# Patient Record
Sex: Female | Born: 1940 | Race: White | Hispanic: No | Marital: Married | Smoking: Never smoker
Health system: Southern US, Community
[De-identification: ages and names within clinical notes are randomized; demographics above are authoritative.]

## PROBLEM LIST (undated history)

## (undated) DIAGNOSIS — S060XAA Concussion with loss of consciousness status unknown, initial encounter: Secondary | ICD-10-CM

## (undated) DIAGNOSIS — I10 Essential (primary) hypertension: Secondary | ICD-10-CM

## (undated) DIAGNOSIS — S060X9A Concussion with loss of consciousness of unspecified duration, initial encounter: Secondary | ICD-10-CM

## (undated) DIAGNOSIS — M542 Cervicalgia: Secondary | ICD-10-CM

## (undated) DIAGNOSIS — M549 Dorsalgia, unspecified: Secondary | ICD-10-CM

## (undated) HISTORY — PX: TONSILLECTOMY: SUR1361

## (undated) HISTORY — PX: UMBILICAL HERNIA REPAIR: SHX196

## (undated) HISTORY — PX: NEPHRECTOMY: SHX65

## (undated) HISTORY — PX: BACK SURGERY: SHX140

---

## 2019-01-22 ENCOUNTER — Other Ambulatory Visit: Payer: Self-pay

## 2019-01-22 ENCOUNTER — Encounter (HOSPITAL_COMMUNITY): Payer: Self-pay

## 2019-01-22 ENCOUNTER — Inpatient Hospital Stay (HOSPITAL_COMMUNITY)
Admission: EM | Admit: 2019-01-22 | Discharge: 2019-01-26 | DRG: 522 | Disposition: A | Discharge status: Skilled Nursing Facility | Payer: Medicare Other | Attending: Internal Medicine | Admitting: Internal Medicine

## 2019-01-22 ENCOUNTER — Emergency Department (HOSPITAL_COMMUNITY): Payer: Medicare Other

## 2019-01-22 DIAGNOSIS — X58XXXD Exposure to other specified factors, subsequent encounter: Secondary | ICD-10-CM | POA: Diagnosis present

## 2019-01-22 DIAGNOSIS — S72041A Displaced fracture of base of neck of right femur, initial encounter for closed fracture: Secondary | ICD-10-CM

## 2019-01-22 DIAGNOSIS — M84375D Stress fracture, left foot, subsequent encounter for fracture with routine healing: Secondary | ICD-10-CM | POA: Diagnosis present

## 2019-01-22 DIAGNOSIS — Z905 Acquired absence of kidney: Secondary | ICD-10-CM

## 2019-01-22 DIAGNOSIS — I10 Essential (primary) hypertension: Secondary | ICD-10-CM | POA: Diagnosis present

## 2019-01-22 DIAGNOSIS — Z79899 Other long term (current) drug therapy: Secondary | ICD-10-CM

## 2019-01-22 DIAGNOSIS — W010XXA Fall on same level from slipping, tripping and stumbling without subsequent striking against object, initial encounter: Secondary | ICD-10-CM | POA: Diagnosis present

## 2019-01-22 DIAGNOSIS — Z419 Encounter for procedure for purposes other than remedying health state, unspecified: Secondary | ICD-10-CM | POA: Diagnosis not present

## 2019-01-22 DIAGNOSIS — S72001A Fracture of unspecified part of neck of right femur, initial encounter for closed fracture: Principal | ICD-10-CM | POA: Diagnosis present

## 2019-01-22 DIAGNOSIS — M542 Cervicalgia: Secondary | ICD-10-CM

## 2019-01-22 DIAGNOSIS — Y92512 Supermarket, store or market as the place of occurrence of the external cause: Secondary | ICD-10-CM

## 2019-01-22 DIAGNOSIS — W19XXXA Unspecified fall, initial encounter: Secondary | ICD-10-CM

## 2019-01-22 DIAGNOSIS — D62 Acute posthemorrhagic anemia: Secondary | ICD-10-CM | POA: Diagnosis not present

## 2019-01-22 DIAGNOSIS — Z20828 Contact with and (suspected) exposure to other viral communicable diseases: Secondary | ICD-10-CM | POA: Diagnosis present

## 2019-01-22 DIAGNOSIS — G8929 Other chronic pain: Secondary | ICD-10-CM | POA: Diagnosis present

## 2019-01-22 DIAGNOSIS — Z888 Allergy status to other drugs, medicaments and biological substances status: Secondary | ICD-10-CM

## 2019-01-22 DIAGNOSIS — N179 Acute kidney failure, unspecified: Secondary | ICD-10-CM | POA: Diagnosis present

## 2019-01-22 DIAGNOSIS — Z96649 Presence of unspecified artificial hip joint: Secondary | ICD-10-CM

## 2019-01-22 DIAGNOSIS — D649 Anemia, unspecified: Secondary | ICD-10-CM | POA: Diagnosis present

## 2019-01-22 HISTORY — DX: Dorsalgia, unspecified: M54.9

## 2019-01-22 HISTORY — DX: Concussion with loss of consciousness of unspecified duration, initial encounter: S06.0X9A

## 2019-01-22 HISTORY — DX: Concussion with loss of consciousness status unknown, initial encounter: S06.0XAA

## 2019-01-22 HISTORY — DX: Cervicalgia: M54.2

## 2019-01-22 HISTORY — DX: Essential (primary) hypertension: I10

## 2019-01-22 LAB — BASIC METABOLIC PANEL
Anion gap: 13 (ref 5–15)
BUN: 28 mg/dL — ABNORMAL HIGH (ref 8–23)
CO2: 22 mmol/L (ref 22–32)
Calcium: 9.3 mg/dL (ref 8.9–10.3)
Chloride: 108 mmol/L (ref 98–111)
Creatinine, Ser: 1.35 mg/dL — ABNORMAL HIGH (ref 0.44–1.00)
GFR calc Af Amer: 44 mL/min — ABNORMAL LOW (ref >60)
GFR calc non Af Amer: 38 mL/min — ABNORMAL LOW (ref >60)
Glucose, Bld: 125 mg/dL — ABNORMAL HIGH (ref 70–99)
Potassium: 4 mmol/L (ref 3.5–5.1)
Sodium: 143 mmol/L (ref 135–145)

## 2019-01-22 LAB — TYPE AND SCREEN
ABO/RH(D): A POS
Antibody Screen: NEGATIVE

## 2019-01-22 LAB — CBC WITH DIFFERENTIAL/PLATELET
Abs Immature Granulocytes: 0.1 10*3/uL — ABNORMAL HIGH (ref 0.00–0.07)
Basophils Absolute: 0.1 10*3/uL (ref 0.0–0.1)
Basophils Relative: 1 %
Eosinophils Absolute: 0.1 10*3/uL (ref 0.0–0.5)
Eosinophils Relative: 1 %
HCT: 36.4 % (ref 36.0–46.0)
Hemoglobin: 11.4 g/dL — ABNORMAL LOW (ref 12.0–15.0)
Immature Granulocytes: 1 %
Lymphocytes Relative: 28 %
Lymphs Abs: 3.1 10*3/uL (ref 0.7–4.0)
MCH: 30.1 pg (ref 26.0–34.0)
MCHC: 31.3 g/dL (ref 30.0–36.0)
MCV: 96 fL (ref 80.0–100.0)
Monocytes Absolute: 0.6 10*3/uL (ref 0.1–1.0)
Monocytes Relative: 5 %
Neutro Abs: 6.9 10*3/uL (ref 1.7–7.7)
Neutrophils Relative %: 64 %
Platelets: 217 10*3/uL (ref 150–400)
RBC: 3.79 MIL/uL — ABNORMAL LOW (ref 3.87–5.11)
RDW: 14.1 % (ref 11.5–15.5)
WBC: 10.8 10*3/uL — ABNORMAL HIGH (ref 4.0–10.5)
nRBC: 0 % (ref 0.0–0.2)

## 2019-01-22 LAB — ABO/RH: ABO/RH(D): A POS

## 2019-01-22 LAB — SARS CORONAVIRUS 2 BY RT PCR (HOSPITAL ORDER, PERFORMED IN ~~LOC~~ HOSPITAL LAB): SARS Coronavirus 2: NEGATIVE

## 2019-01-22 MED ORDER — CEFAZOLIN SODIUM-DEXTROSE 2-4 GM/100ML-% IV SOLN
2.0000 g | INTRAVENOUS | Status: AC
  Administered 2019-01-23: 09:00:00 2 g via INTRAVENOUS
  Filled 2019-01-22: qty 100

## 2019-01-22 MED ORDER — FENTANYL CITRATE (PF) 100 MCG/2ML IJ SOLN
75.0000 ug | Freq: Once | INTRAMUSCULAR | Status: AC
  Administered 2019-01-22: 75 ug via INTRAVENOUS
  Filled 2019-01-22: qty 2

## 2019-01-22 MED ORDER — POVIDONE-IODINE 10 % EX SWAB: 2.0000 "application " | Freq: Once | CUTANEOUS | Status: DC

## 2019-01-22 MED ORDER — HYDRALAZINE HCL 20 MG/ML IJ SOLN: 10.0000 mg | INTRAMUSCULAR | Status: DC | PRN

## 2019-01-22 MED ORDER — TRANEXAMIC ACID 1000 MG/10ML IV SOLN: 2000.0000 mg | INTRAVENOUS | Status: DC

## 2019-01-22 MED ORDER — TRANEXAMIC ACID 1000 MG/10ML IV SOLN
2000.0000 mg | INTRAVENOUS | Status: AC
  Filled 2019-01-22: qty 20

## 2019-01-22 MED ORDER — ENSURE PRE-SURGERY PO LIQD
296.0000 mL | Freq: Once | ORAL | Status: AC
  Administered 2019-01-22: 296 mL via ORAL
  Filled 2019-01-22: qty 296

## 2019-01-22 MED ORDER — TRANEXAMIC ACID-NACL 1000-0.7 MG/100ML-% IV SOLN
1000.0000 mg | INTRAVENOUS | Status: AC
  Administered 2019-01-23: 10:00:00 1000 mg via INTRAVENOUS

## 2019-01-22 MED ORDER — TRANEXAMIC ACID-NACL 1000-0.7 MG/100ML-% IV SOLN: 1000.0000 mg | INTRAVENOUS | Status: DC

## 2019-01-22 MED ORDER — ENSURE PRE-SURGERY PO LIQD
296.0000 mL | Freq: Once | ORAL | Status: DC
  Filled 2019-01-22: qty 296

## 2019-01-22 MED ORDER — MORPHINE SULFATE (PF) 2 MG/ML IV SOLN
0.5000 mg | INTRAVENOUS | Status: DC | PRN
  Administered 2019-01-22 – 2019-01-23 (×3): 0.5 mg via INTRAVENOUS
  Filled 2019-01-22 (×4): qty 1

## 2019-01-22 MED ORDER — CEFAZOLIN SODIUM-DEXTROSE 2-4 GM/100ML-% IV SOLN: 2.0000 g | INTRAVENOUS | Status: DC

## 2019-01-22 NOTE — ED Triage Notes (Signed)
Patient arrives via EMS due to falling in [place]. Per ems, patient was walking with her walker when she lost her footing and fell on her right side. No LOC and patient did not hit her head.   Per patient, she recently started taking lyrica and it has made her unsteady.    Patient arrives with a 20g  L. AC  EMS medicated PTA with 124mcg of Fentanyl and 4mg  of Zofran.

## 2019-01-22 NOTE — H&P (Signed)
History and Physical    Danielle FreshwaterVera Ayala YNW:[id number] DOB: 1940-11-03 DOA: 01/22/2019  PCP: Jolene ProvostHaimes, David M, MD  Patient coming from: Home.  Chief Complaint: Fall.  HPI: Danielle FreshwaterVera Ayala is a 78 y.o. female with history of hypertension with history of left-sided nephrectomy for tumor, neck pain on Lyrica was brought to the ER [facility]  Patient states she thinks she may have tripped on something and fell.  Denies hitting head or losing consciousness.  Denies any chest pain or shortness of breath.  Since patient is having pain in the right hip area was brought to the ER.  Patient has had a recent left foot stress fracture for which patient is wearing a brace.  ED Course: X-ray showed right hip fracture.  On-call orthopedic surgeon Dr. Roda ShuttersXu has been consulted and plans to do surgery in the morning.  Point-of-care COVID-19 was negative.  Chest x-ray unremarkable.  Labs show mild elevated creatinine from baseline when compared to May 2020 presently it is around 1.3.  Hemoglobin is around baseline with chronic anemia.  Review of Systems: As per HPI, rest all negative.   Past Medical History:  Diagnosis Date  . Back pain   . Concussion 1967 & 1985  . Hypertension   . Neck pain     Past Surgical History:  Procedure Laterality Date  . BACK SURGERY    . NEPHRECTOMY Left   . TONSILLECTOMY    . UMBILICAL HERNIA REPAIR       reports that she has never smoked. She has never used smokeless tobacco. She reports that she does not drink alcohol or use drugs.  Allergies  Allergen Reactions  . Cleocin [Clindamycin Hcl] Rash  . Cholesterol     Cholesterol medications make her sick    Family History  Problem Relation Age of Onset  . Cancer Mother     Prior to Admission medications   Medication Sig Start Date End Date Taking? Authorizing Provider  docusate sodium (COLACE) 100 MG capsule Take 100 mg by mouth daily.   Yes [provider]  losartan (COZAAR) 100 MG  tablet Take 100 mg by mouth daily.   Yes [provider]  pregabalin (LYRICA) 150 MG capsule Take 150 mg by mouth 2 (two) times daily.   Yes [provider]    Physical Exam: Constitutional: Moderately built and nourished. Vitals:   01/22/19 1946 01/22/19 2015 01/22/19 2100 01/22/19 2137  BP: (!) 173/65 (!) 154/60 (!) 155/73 (!) 164/73  Pulse: 76 72 67 74  Resp: 13 (!) 23 17   Temp:    98.3 F (36.8 C)  TempSrc:    Oral  SpO2: 95% 97% 99% 96%  Weight:      Height:       Eyes: Anicteric no pallor. ENMT: No discharge from the ears eyes nose or mouth. Neck: No mass or.  No neck rigidity. Respiratory: No rhonchi or crepitations. Cardiovascular: S1-S2 heard. Abdomen: Soft nontender bowel sounds present. Musculoskeletal: Left foot has a brace.  Right foot lower extremity has pain on movement. Skin: No rash. Neurologic: Alert awake oriented to time place and person.  Moves all extremities. Psychiatric: Appears normal.   Labs on Admission: I have personally reviewed following labs and imaging studies  CBC: Recent Labs  Lab 01/22/19 1745  WBC 10.8*  NEUTROABS 6.9  HGB 11.4*  HCT 36.4  MCV 96.0  PLT 217   Basic Metabolic Panel: Recent Labs  Lab 01/22/19  1745  NA 143  K 4.0  CL 108  CO2 22  GLUCOSE 125*  BUN 28*  CREATININE 1.35*  CALCIUM 9.3   GFR: Estimated Creatinine Clearance: 36.7 mL/min (A) (by C-G formula based on SCr of 1.35 mg/dL (H)). Liver Function Tests: No results for input(s): AST, ALT, ALKPHOS, BILITOT, PROT, ALBUMIN in the last 168 hours. No results for input(s): LIPASE, AMYLASE in the last 168 hours. No results for input(s): AMMONIA in the last 168 hours. Coagulation Profile: No results for input(s): INR, PROTIME in the last 168 hours. Cardiac Enzymes: No results for input(s): CKTOTAL, CKMB, CKMBINDEX, TROPONINI in the last 168 hours. BNP (last 3 results) No results for input(s): PROBNP in the last 8760 hours. HbA1C: No  results for input(s): HGBA1C in the last 72 hours. CBG: No results for input(s): GLUCAP in the last 168 hours. Lipid Profile: No results for input(s): CHOL, HDL, LDLCALC, TRIG, CHOLHDL, LDLDIRECT in the last 72 hours. Thyroid Function Tests: No results for input(s): TSH, T4TOTAL, FREET4, T3FREE, THYROIDAB in the last 72 hours. Anemia Panel: No results for input(s): VITAMINB12, FOLATE, FERRITIN, TIBC, IRON, RETICCTPCT in the last 72 hours. Urine analysis: No results found for: COLORURINE, APPEARANCEUR, LABSPEC, PHURINE, GLUCOSEU, HGBUR, BILIRUBINUR, KETONESUR, PROTEINUR, UROBILINOGEN, NITRITE, LEUKOCYTESUR Sepsis Labs: @LABRCNTIP (procalcitonin:4,lacticidven:4) ) Recent Results (from the past 240 hour(s))  SARS Coronavirus 2 by RT PCR (hospital order, performed in [facility] hospital lab) Nasopharyngeal Nasopharyngeal Swab     Status: None   Collection Time: 01/22/19  8:02 PM   Specimen: Nasopharyngeal Swab  Result Value Ref Range Status   SARS Coronavirus 2 NEGATIVE NEGATIVE Final    Comment: (NOTE) SARS-CoV-2 target nucleic acids are NOT DETECTED. The SARS-CoV-2 RNA is generally detectable in upper and lower respiratory specimens during the acute phase of infection. The lowest concentration of SARS-CoV-2 viral copies this assay can detect is 250 copies / mL. A negative result does not preclude SARS-CoV-2 infection and should not be used as the sole basis for treatment or other patient management decisions.  A negative result may occur with improper specimen collection / handling, submission of specimen other than nasopharyngeal swab, presence of viral mutation(s) within the areas targeted by this assay, and inadequate number of viral copies (<250 copies / mL). A negative result must be combined with clinical observations, patient history, and epidemiological information. Fact Sheet for Patients:   14/04/20 Fact Sheet for Healthcare  Providers: BoilerBrush.com.cy This test is not yet approved or cleared  by the https://pope.com/ FDA and has been authorized for detection and/or diagnosis of SARS-CoV-2 by FDA under an Emergency Use Authorization (EUA).  This EUA will remain in effect (meaning this test can be used) for the duration of the COVID-19 declaration under Section 564(b)(1) of the Act, 21 U.S.C. section 360bbb-3(b)(1), unless the authorization is terminated or revoked sooner. Performed at [facility], [address] [place], [place]      Radiological Exams on Admission: Dg Chest 1 View  Result Date: 01/22/2019 CLINICAL DATA:  Preop EXAM: CHEST  1 VIEW COMPARISON:  None. FINDINGS: The heart size and mediastinal contours are within normal limits. Both lungs are clear. The visualized skeletal structures are unremarkable. IMPRESSION: No active disease. Electronically Signed   By: 14/05/2018 M.D.   On: 01/22/2019 19:11   Dg Hip Unilat With Pelvis 2-3 Views Right  Result Date: 01/22/2019 CLINICAL DATA:  Fall from standing EXAM: DG HIP (WITH OR WITHOUT PELVIS) 2-3V RIGHT COMPARISON:  None.  FINDINGS: There is a mildly displaced fracture of the right femoral neck with superior subluxation of the femoral shaft. The femoral head still articulates with the acetabulum. There also appears to be cortical disruption of the inferior left sacrum. Diffuse osteopenia seen. 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 IMPRESSION: Mildly displaced right femoral neck fracture with superior subluxation of the femoral shaft. Question of a nondisplaced fracture of the left sacral ala. Electronically Signed   By: Prudencio Pair M.D.   On: 01/22/2019 19:11     Assessment/Plan Principal Problem:   Closed fracture of neck of right femur (HCC) Active Problems:   Essential hypertension   Normochromic normocytic anemia   Closed right hip fracture, initial encounter ([place])    1. Right hip fracture status  post mechanical fall for which orthopedic surgeon has been consulted.  We will keep patient n.p.o. past midnight.  Pain relief medications.  Further recommendations per orthopedics. 2. Hypertension -since patient is going to be n.p.o. and also has mildly elevated creatinine from baseline for now we will hold off ARB and keep patient on as needed IV hydralazine.  After surgery if creatinine is improving may resume ARB. 3. Acute renal failure with single kidney has had previous history of nephrectomy on the left side for edema.  Cause not clear.  Creatinine is mildly elevated from previous.  Will hold ARB gently hydrate.  Follow metabolic panel. 4. Anemia appears to be chronic follow CBC. 5. History of neck pain on Lyrica which can be restarted after surgery.  Given the hip fracture will need more than 2 midnight stay in inpatient status. Point-of-care Covid was negative.  Repeat Covid for 24 hours PCR is pending.   DVT prophylaxis: SCDs in anticipation of procedure. Code Status: Full code. Family Communication: Discussed with patient. Disposition Plan: Home. Consults called: Orthopedics. Admission status: Inpatient.   Rise Patience MD [facility] Pager [phone].  If 7PM-7AM, please contact night-coverage [email or web address] Password [facility]  01/22/2019, 10:36 PM

## 2019-01-22 NOTE — Consult Note (Signed)
ORTHOPAEDIC CONSULTATION  REQUESTING PHYSICIAN: Eduard Clos, MD  Chief Complaint: Right femoral neck fracture  HPI: Danielle Ayala is a 78 y.o. female who presents with right hip fracture s/p mechanical fall at [employer].  Witnesses state she tripped over a sign.  She's been wearing a postop shoe on her left foot for a stress fracture.  The patient endorses severe pain in the right hip, that does not radiate, grinding in quality, worse with any movement, better with immobilization.  Denies LOC/fever/chills/nausea/vomiting.  Walks without assistive devices (walker, cane, wheelchair).  Lives with husband at home and still drives herself and is independent with ADLs.  Denies LOC, neck pain, abd pain.  Past Medical History:  Diagnosis Date  . Back pain   . Concussion 1967 & 1985  . Neck pain    Past Surgical History:  Procedure Laterality Date  . BACK SURGERY    . NEPHRECTOMY Left   . TONSILLECTOMY    . UMBILICAL HERNIA REPAIR     Social History   Socioeconomic History  . Marital status: Married    Spouse name: Not on file  . Number of children: Not on file  . Years of education: Not on file  . Highest education level: Not on file  Occupational History  . Not on file  Social Needs  . Financial resource strain: Not on file  . Food insecurity    Worry: Not on file    Inability: Not on file  . Transportation needs    Medical: Not on file    Non-medical: Not on file  Tobacco Use  . Smoking status: Never Smoker  . Smokeless tobacco: Never Used  Substance and Sexual Activity  . Alcohol use: Never    Frequency: Never  . Drug use: Never  . Sexual activity: Not on file  Lifestyle  . Physical activity    Days per week: Not on file    Minutes per session: Not on file  . Stress: Not on file  Relationships  . Social Musician on phone: Not on file    Gets together: Not on file    Attends religious service: Not on file    Active member of club or organization:  Not on file    Attends meetings of clubs or organizations: Not on file    Relationship status: Not on file  Other Topics Concern  . Not on file  Social History Narrative  . Not on file   Family History  Problem Relation Age of Onset  . Cancer Mother    Allergies  Allergen Reactions  . Cleocin [Clindamycin Hcl] Rash  . Cholesterol     Cholesterol medications make her sick   Prior to Admission medications   Medication Sig Start Date End Date Taking? Authorizing Provider  docusate sodium (COLACE) 100 MG capsule Take 100 mg by mouth daily.   Yes [provider]  losartan (COZAAR) 100 MG tablet Take 100 mg by mouth daily.   Yes [provider]  pregabalin (LYRICA) 150 MG capsule Take 150 mg by mouth 2 (two) times daily.   Yes [provider]   Dg Chest 1 View  Result Date: 01/22/2019 CLINICAL DATA:  Preop EXAM: CHEST  1 VIEW COMPARISON:  None. FINDINGS: The heart size and mediastinal contours are within normal limits. Both lungs are clear. The visualized skeletal structures are unremarkable. IMPRESSION: No active disease. Electronically Signed   By: Jonna Clark M.D.   On:  01/22/2019 19:11   Dg Hip Unilat With Pelvis 2-3 Views Right  Result Date: 01/22/2019 CLINICAL DATA:  Fall from standing EXAM: DG HIP (WITH OR WITHOUT PELVIS) 2-3V RIGHT COMPARISON:  None. FINDINGS: There is a mildly displaced fracture of the right femoral neck with superior subluxation of the femoral shaft. The femoral head still articulates with the acetabulum. There also appears to be cortical disruption of the inferior left sacrum. Diffuse osteopenia seen. 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 IMPRESSION: Mildly displaced right femoral neck fracture with superior subluxation of the femoral shaft. Question of a nondisplaced fracture of the left sacral ala. Electronically Signed   By: Prudencio Pair M.D.   On: 01/22/2019 19:11    All pertinent xrays, MRI, CT independently reviewed and  interpreted  Positive ROS: All other systems have been reviewed and were otherwise negative with the exception of those mentioned in the HPI and as above.  Physical Exam: General: Alert, no acute distress Cardiovascular: No pedal edema Respiratory: No cyanosis, no use of accessory musculature GI: No organomegaly, abdomen is soft and non-tender Skin: No lesions in the area of chief complaint Neurologic: Sensation intact distally Psychiatric: Patient is competent for consent with normal mood and affect Lymphatic: No axillary or cervical lymphadenopathy  MUSCULOSKELETAL:  - severe pain with movement of the hip and extremity - skin intact - NVI distally - compartments soft  Assessment: Right femoral neck fracture  Plan: - total hip replacement is recommended, patient and family are aware of r/b/a and wish to proceed, informed consent obtained - medical optimization per primary team - surgery is planned for tomorrow morning - NPO after midnight - hold lovenox  Thank you for the consult and the opportunity to see Danielle Ayala. Eduard Roux, MD [facility] 9:53 PM

## 2019-01-22 NOTE — ED Notes (Signed)
ED TO INPATIENT HANDOFF REPORT  ED Nurse Name and Phone #: Rever Pichette [phone]  S Name/Age/Gender Danielle Ayala 78 y.o. female Room/Bed: 040C/040C  Code Status   Code Status: Not on file  Home/SNF/Other Home Patient oriented to: self, place, time and situation Is this baseline? Yes   Triage Complete: Triage complete  Chief Complaint Fall, Hip pain  Triage Note Patient arrives via EMS due to falling in [employer]. Per ems, patient was walking with her walker when she lost her footing and fell on her right side. No LOC and patient did not hit her head.   Per patient, she recently started taking lyrica and it has made her unsteady.    Patient arrives with a 20g  L. AC  EMS medicated PTA with of Fentanyl and 4mg  of Zofran.    Allergies Allergies  Allergen Reactions  . Cleocin [Clindamycin Hcl] Rash  . Cholesterol     Cholesterol medications make her sick    Level of Care/Admitting Diagnosis ED Disposition    ED Disposition Condition Comment   Admit  Hospital Area: [facility] [[id number]]  Level of Care: Telemetry Medical [104]  Covid Evaluation: Asymptomatic Screening Protocol (No Symptoms)  Diagnosis: [facility]  Admitting Physician: ) [[id number]] [phone]  Attending Physician: [[phone]  Estimated length of stay: past midnight tomorrow  Certification:: I certify this patient will need inpatient services for at least 2 midnights  PT Class (Do Not Modify): Inpatient [101]  PT Acc Code (Do Not Modify): Private [1]       B Medical/Surgery History Past Medical History:  Diagnosis Date  . Back pain   . Concussion 1967 & 1985  . Neck pain    Past Surgical History:  Procedure Laterality Date  . BACK SURGERY    . NEPHRECTOMY Left   . TONSILLECTOMY    . UMBILICAL HERNIA REPAIR       A IV Location/Drains/Wounds Patient Lines/Drains/Airways Status   Active Line/Drains/Airways    Name:   Placement date:    Placement time:   Site:   Days:   Peripheral IV 01/22/19 Right Hand   01/22/19    1420    Hand   less than 1          Intake/Output Last 24 hours No intake or output data in the 24 hours ending 01/22/19 2038  Labs/Imaging Results for orders placed or performed during the hospital encounter of 01/22/19 (from the past 48 hour(s))  Basic metabolic panel     Status: Abnormal   Collection Time: 01/22/19  5:45 PM  Result Value Ref Range   Sodium 143 135 - 145 mmol/L   Potassium 4.0 3.5 - 5.1 mmol/L    Comment: SLIGHT HEMOLYSIS   Chloride 108 98 - 111 mmol/L   CO2 22 22 - 32 mmol/L   Glucose, Bld 125 (H) 70 - 99 mg/dL   BUN 28 (H) 8 - 23 mg/dL   Creatinine, Ser 14/04/20 (H) 0.44 - 1.00 mg/dL   Calcium 9.3 8.9 - 6.80 mg/dL   GFR calc non Af Amer 38 (L) >60 mL/min   GFR calc Af Amer 44 (L) >60 mL/min   Anion gap 13 5 - 15    Comment: Performed at [facility], [address] [place], [place]  CBC with Differential     Status: Abnormal   Collection Time: 01/22/19  5:45 PM  Result Value Ref Range   WBC  10.8 (H) 4.0 - 10.5 K/uL   RBC 3.79 (L) 3.87 - 5.11 MIL/uL   Hemoglobin 11.4 (L) 12.0 - 15.0 g/dL   HCT 36.4 36.0 - 46.0 %   MCV 96.0 80.0 - 100.0 fL   MCH 30.1 26.0 - 34.0 pg   MCHC 31.3 30.0 - 36.0 g/dL   RDW 14.1 11.5 - 15.5 %   Platelets 217 150 - 400 K/uL   nRBC 0.0 0.0 - 0.2 %   Neutrophils Relative % 64 %   Neutro Abs 6.9 1.7 - 7.7 K/uL   Lymphocytes Relative 28 %   Lymphs Abs 3.1 0.7 - 4.0 K/uL   Monocytes Relative 5 %   Monocytes Absolute 0.6 0.1 - 1.0 K/uL   Eosinophils Relative 1 %   Eosinophils Absolute 0.1 0.0 - 0.5 K/uL   Basophils Relative 1 %   Basophils Absolute 0.1 0.0 - 0.1 K/uL   Immature Granulocytes 1 %   Abs Immature Granulocytes 0.10 (H) 0.00 - 0.07 K/uL    Comment: Performed at [place] [address], [place], [place] [address]  Type and screen [place]     Status: None   Collection Time: 01/22/19  5:48 PM   Result Value Ref Range   ABO/RH(D) A POS    Antibody Screen NEG    Sample Expiration      01/25/2019,2359 Performed at [facility], [place] [address], [place], [place] [address]   ABO/Rh     Status: None   Collection Time: 01/22/19  5:48 PM  Result Value Ref Range   ABO/RH(D)      A POS Performed at [place] [address] [place], [place] [address]    Dg Chest 1 View  Result Date: 01/22/2019 CLINICAL DATA:  Preop EXAM: CHEST  1 VIEW COMPARISON:  None. FINDINGS: The heart size and mediastinal contours are within normal limits. Both lungs are clear. The visualized skeletal structures are unremarkable. IMPRESSION: No active disease. Electronically Signed   By: Prudencio Pair M.D.   On: 01/22/2019 19:11   Dg Hip Unilat With Pelvis 2-3 Views Right  Result Date: 01/22/2019 CLINICAL DATA:  Fall from standing EXAM: DG HIP (WITH OR WITHOUT PELVIS) 2-3V RIGHT COMPARISON:  None. FINDINGS: There is a mildly displaced fracture of the right femoral neck with superior subluxation of the femoral shaft. The femoral head still articulates with the acetabulum. There also appears to be cortical disruption of the inferior left sacrum. Diffuse osteopenia seen. 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 IMPRESSION: Mildly displaced right femoral neck fracture with superior subluxation of the femoral shaft. Question of a nondisplaced fracture of the left sacral ala. Electronically Signed   By: Prudencio Pair M.D.   On: 01/22/2019 19:11    Pending Labs Unresulted Labs (From admission, onward)    Start     Ordered   01/22/19 1857  SARS Coronavirus 2 by RT PCR (hospital order, performed in [facility] hospital lab) Nasopharyngeal Nasopharyngeal Swab  ONCE - STAT,   STAT    Comments: Pt going to the or tonight   Question Answer Comment  Is this test for diagnosis or screening Screening   Symptomatic for COVID-19 as defined by CDC No   Hospitalized for COVID-19 No   Admitted to ICU for COVID-19 No    Previously tested for COVID-19 No   Resident in a congregate (group) care setting Unknown   Employed in healthcare setting Unknown  Pregnant No      01/22/19 1857          Vitals/Pain Today's Vitals   01/22/19 1855 01/22/19 1946 01/22/19 1956 01/22/19 2015  BP:  (!) 173/65  (!) 154/60  Pulse:  76  72  Resp:  13  (!) 23  Temp:      TempSrc:      SpO2:  95%  97%  Weight:      Height:      PainSc: 9   4      Isolation Precautions No active isolations  Medications Medications  fentaNYL (SUBLIMAZE) injection 75 mcg (75 mcg Intravenous Given 01/22/19 1746)  fentaNYL (SUBLIMAZE) injection 75 mcg (75 mcg Intravenous Given 01/22/19 2000)    Mobility walks with person assist High fall risk   Focused Assessments musc   R Recommendations: See Admitting Provider Note  Report given to:   Additional Notes:

## 2019-01-22 NOTE — ED Provider Notes (Signed)
[facility] EMERGENCY DEPARTMENT Provider Note   CSN: [id number] Arrival date & time: 01/22/19  1656     History   Chief Complaint Chief Complaint  Patient presents with  . Fall    HPI Danielle Ayala is a 78 y.o. female presenting to the ED via EMS with complaint of sudden onset of right hip pain after mechanical fall that occurred prior to arrival.  Patient states she lost her balance which is been typical for her after starting the medication Lyrica for her chronic neck pain.  She states she lost her balance falling onto her right hip with sudden onset of pain.  Pain is worse with any movement or palpation located in her right groin.  She denies numbness or tingling to the right foot.  Denies head trauma or LOC.  No new neck or back pain.  No other injuries reported.  Not on anticoagulation.  She was given 100 mcg of fentanyl in route by EMS with significant improvement in symptoms, however she states pain is somewhat returning and significantly worse with any slight movement.     The history is provided by the patient.    Past Medical History:  Diagnosis Date  . Back pain   . Concussion 1967 & 1985  . Neck pain     Patient Active Problem List   Diagnosis Date Noted  . Closed displaced fracture of right femoral neck (HCC) 01/22/2019  . Hip fracture (HCC) 01/22/2019    Past Surgical History:  Procedure Laterality Date  . BACK SURGERY    . NEPHRECTOMY Left   . TONSILLECTOMY    . UMBILICAL HERNIA REPAIR       OB History   No obstetric history on file.      Home Medications    Prior to Admission medications   Not on File    Family History Family History  Problem Relation Age of Onset  . Cancer Mother     Social History Social History   Tobacco Use  . Smoking status: Never Smoker  . Smokeless tobacco: Never Used  Substance Use Topics  . Alcohol use: Never    Frequency: Never  . Drug use: Never     Allergies   Cleocin [clindamycin  hcl] and Cholesterol   Review of Systems Review of Systems  All other systems reviewed and are negative.    Physical Exam Updated Vital Signs BP (!) 157/71   Pulse 63   Temp 98.1 F (36.7 C) (Oral)   Resp 18   Ht 5\' 4"  (1.626 m)   Wt 81.6 kg   SpO2 100%   BMI 30.90 kg/m   Physical Exam Vitals signs and nursing note reviewed.  Constitutional:      Appearance: She is well-developed.  HENT:     Head: Normocephalic and atraumatic.  Eyes:     Conjunctiva/sclera: Conjunctivae normal.  Neck:     Musculoskeletal: Normal range of motion.  Cardiovascular:     Rate and Rhythm: Normal rate and regular rhythm.  Pulmonary:     Effort: Pulmonary effort is normal. No respiratory distress.     Breath sounds: Normal breath sounds.  Abdominal:     General: Bowel sounds are normal.     Palpations: Abdomen is soft.     Tenderness: There is no abdominal tenderness.  Musculoskeletal:     Comments: Right LE with slight shortening with external rotation. Unable to range hip 2/t to pain. Right knee and ankle are atraumatic  without TTP.  Left foot with post-op shoe in place. BUE are atraumatic with full nl ROM without pain.  Skin:    General: Skin is warm.  Neurological:     Mental Status: She is alert and oriented to person, place, and time.     Comments: Normal sensation to BLE. Strong DP pulses  Psychiatric:        Behavior: Behavior normal.      ED Treatments / Results  Labs (all labs ordered are listed, but only abnormal results are displayed) Labs Reviewed  BASIC METABOLIC PANEL - Abnormal; Notable for the following components:      Result Value   Glucose, Bld 125 (*)    BUN 28 (*)    Creatinine, Ser 1.35 (*)    GFR calc non Af Amer 38 (*)    GFR calc Af Amer 44 (*)    All other components within normal limits  CBC WITH DIFFERENTIAL/PLATELET - Abnormal; Notable for the following components:   WBC 10.8 (*)    RBC 3.79 (*)    Hemoglobin 11.4 (*)    Abs Immature  Granulocytes 0.10 (*)    All other components within normal limits  SARS CORONAVIRUS 2 BY RT PCR (HOSPITAL ORDER, PERFORMED IN [facility])  TYPE AND SCREEN  ABO/RH    EKG None  Radiology Dg Chest 1 View  Result Date: 01/22/2019 CLINICAL DATA:  Preop EXAM: CHEST  1 VIEW COMPARISON:  None. FINDINGS: The heart size and mediastinal contours are within normal limits. Both lungs are clear. The visualized skeletal structures are unremarkable. IMPRESSION: No active disease. Electronically Signed   By: Jonna ClarkBindu  Avutu M.D.   On: 01/22/2019 19:11   Dg Hip Unilat With Pelvis 2-3 Views Right  Result Date: 01/22/2019 CLINICAL DATA:  Fall from standing EXAM: DG HIP (WITH OR WITHOUT PELVIS) 2-3V RIGHT COMPARISON:  None. FINDINGS: There is a mildly displaced fracture of the right femoral neck with superior subluxation of the femoral shaft. The femoral head still articulates with the acetabulum. There also appears to be cortical disruption of the inferior left sacrum. Diffuse osteopenia seen. 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 IMPRESSION: Mildly displaced right femoral neck fracture with superior subluxation of the femoral shaft. Question of a nondisplaced fracture of the left sacral ala. Electronically Signed   By: Jonna ClarkBindu  Avutu M.D.   On: 01/22/2019 19:11    Procedures Procedures (including critical care time)  Medications Ordered in ED Medications  fentaNYL (SUBLIMAZE) injection 75 mcg (75 mcg Intravenous Given 01/22/19 1746)  fentaNYL (SUBLIMAZE) injection 75 mcg (75 mcg Intravenous Given 01/22/19 2000)     Initial Impression / Assessment and Plan / ED Course  I have reviewed the triage vital signs and the nursing notes.  Pertinent labs & imaging results that were available during my care of the patient were reviewed by me and considered in my medical decision making (see chart for details).  Clinical Course as of Jan 21 2001  Fri Jan 22, 2019  [id number] Dr. Roda ShuttersXu to perform ORIF  tomorrow morning. Will consult medicine service for admission   [JR]  1948 Dr. Toniann FailKakrakandy accepting admission   [JR]    Clinical Course User Index [JR] Robinson, [place] N, PA-C       Patient presenting with right hip pain after ground-level [facility].  No head trauma or LOC.  Not on anticoagulation.  Right leg with external rotation and slight shortening on exam with significant  pain with range of motion.  Neurovascularly intact.  No other injuries.  X-ray is positive for mildly displaced right femoral neck fracture.  Consulted with Dr.Xu with orthopedics.  Request medical admission, ORIF for tomorrow morning.  Patient agreeable to plan.  Final Clinical Impressions(s) / ED Diagnoses   Final diagnoses:  Closed fracture of neck of right femur, initial encounter [facility])  Fall, initial encounter    ED Discharge Orders    None       Robinson, [place] N, PA-C 01/22/19 Melene Muller, MD 01/23/19 1018

## 2019-01-22 NOTE — Progress Notes (Signed)
I have spoken to EDP and aware of the patient's injury.  Plan is for surgery tomorrow morning pending medical clearance.  NPO after midnight.  Please hold lovenox for impending surgery.  Azucena Cecil, MD [facility] [phone] 7:49 PM

## 2019-01-23 ENCOUNTER — Inpatient Hospital Stay (HOSPITAL_COMMUNITY): Payer: Medicare Other | Admitting: Anesthesiology

## 2019-01-23 ENCOUNTER — Encounter (HOSPITAL_COMMUNITY)
Admission: EM | Disposition: A | Discharge status: Skilled Nursing Facility | Payer: Self-pay | Source: Home / Self Care | Attending: Internal Medicine

## 2019-01-23 ENCOUNTER — Encounter (HOSPITAL_COMMUNITY): Payer: Self-pay | Admitting: Anesthesiology

## 2019-01-23 ENCOUNTER — Inpatient Hospital Stay (HOSPITAL_COMMUNITY): Payer: Medicare Other

## 2019-01-23 DIAGNOSIS — S72041A Displaced fracture of base of neck of right femur, initial encounter for closed fracture: Secondary | ICD-10-CM

## 2019-01-23 DIAGNOSIS — W19XXXA Unspecified fall, initial encounter: Secondary | ICD-10-CM

## 2019-01-23 HISTORY — PX: TOTAL HIP ARTHROPLASTY: SHX124

## 2019-01-23 LAB — CBC
HCT: 34.7 % — ABNORMAL LOW (ref 36.0–46.0)
Hemoglobin: 11.4 g/dL — ABNORMAL LOW (ref 12.0–15.0)
MCH: 30.5 pg (ref 26.0–34.0)
MCHC: 32.9 g/dL (ref 30.0–36.0)
MCV: 92.8 fL (ref 80.0–100.0)
Platelets: 188 10*3/uL (ref 150–400)
RBC: 3.74 MIL/uL — ABNORMAL LOW (ref 3.87–5.11)
RDW: 13.8 % (ref 11.5–15.5)
WBC: 9.6 10*3/uL (ref 4.0–10.5)
nRBC: 0 % (ref 0.0–0.2)

## 2019-01-23 LAB — BASIC METABOLIC PANEL
Anion gap: 12 (ref 5–15)
BUN: 22 mg/dL (ref 8–23)
CO2: 22 mmol/L (ref 22–32)
Calcium: 9.3 mg/dL (ref 8.9–10.3)
Chloride: 104 mmol/L (ref 98–111)
Creatinine, Ser: 1.27 mg/dL — ABNORMAL HIGH (ref 0.44–1.00)
GFR calc Af Amer: 47 mL/min — ABNORMAL LOW (ref >60)
GFR calc non Af Amer: 41 mL/min — ABNORMAL LOW (ref >60)
Glucose, Bld: 147 mg/dL — ABNORMAL HIGH (ref 70–99)
Potassium: 4.2 mmol/L (ref 3.5–5.1)
Sodium: 138 mmol/L (ref 135–145)

## 2019-01-23 LAB — SURGICAL PCR SCREEN
MRSA, PCR: NEGATIVE
Staphylococcus aureus: NEGATIVE

## 2019-01-23 SURGERY — ARTHROPLASTY, HIP, TOTAL, ANTERIOR APPROACH
Anesthesia: General | Site: Hip | Laterality: Right

## 2019-01-23 MED ORDER — MENTHOL 3 MG MT LOZG: 1.0000 | LOZENGE | OROMUCOSAL | Status: DC | PRN

## 2019-01-23 MED ORDER — TRANEXAMIC ACID 1000 MG/10ML IV SOLN
2000.0000 mg | INTRAVENOUS | Status: AC
  Administered 2019-01-23: 2000 mg via TOPICAL
  Filled 2019-01-23: qty 20

## 2019-01-23 MED ORDER — ACETAMINOPHEN 325 MG PO TABS: 325.0000 mg | ORAL_TABLET | Freq: Four times a day (QID) | ORAL | Status: DC | PRN

## 2019-01-23 MED ORDER — LACTATED RINGERS IV SOLN
INTRAVENOUS | Status: AC
  Administered 2019-01-23 (×2): via INTRAVENOUS

## 2019-01-23 MED ORDER — SODIUM CHLORIDE 0.9 % IV SOLN
INTRAVENOUS | Status: DC
  Administered 2019-01-23: 22:00:00 via INTRAVENOUS

## 2019-01-23 MED ORDER — ONDANSETRON HCL 4 MG/2ML IJ SOLN
INTRAMUSCULAR | Status: AC
  Filled 2019-01-23: qty 2

## 2019-01-23 MED ORDER — 0.9 % SODIUM CHLORIDE (POUR BTL) OPTIME
TOPICAL | Status: DC | PRN
  Administered 2019-01-23: 1000 mL

## 2019-01-23 MED ORDER — HYDROCODONE-ACETAMINOPHEN 7.5-325 MG PO TABS
1.0000 | ORAL_TABLET | ORAL | Status: DC | PRN
  Administered 2019-01-26 (×2): 2 via ORAL
  Filled 2019-01-23 (×2): qty 2

## 2019-01-23 MED ORDER — ADULT MULTIVITAMIN W/MINERALS CH
1.0000 | ORAL_TABLET | Freq: Every day | ORAL | Status: DC
  Administered 2019-01-24 – 2019-01-26 (×3): 1 via ORAL
  Filled 2019-01-23 (×3): qty 1

## 2019-01-23 MED ORDER — ROCURONIUM BROMIDE 10 MG/ML (PF) SYRINGE
PREFILLED_SYRINGE | INTRAVENOUS | Status: AC
  Filled 2019-01-23: qty 10

## 2019-01-23 MED ORDER — PHENOL 1.4 % MT LIQD: 1.0000 | OROMUCOSAL | Status: DC | PRN

## 2019-01-23 MED ORDER — HYDRALAZINE HCL 20 MG/ML IJ SOLN
INTRAMUSCULAR | Status: AC
  Filled 2019-01-23: qty 1

## 2019-01-23 MED ORDER — TRANEXAMIC ACID-NACL 1000-0.7 MG/100ML-% IV SOLN
INTRAVENOUS | Status: AC
  Filled 2019-01-23: qty 100

## 2019-01-23 MED ORDER — ONDANSETRON HCL 4 MG/2ML IJ SOLN
4.0000 mg | Freq: Four times a day (QID) | INTRAMUSCULAR | Status: DC | PRN
  Administered 2019-01-23: 4 mg via INTRAVENOUS

## 2019-01-23 MED ORDER — ACETAMINOPHEN 500 MG PO TABS
500.0000 mg | ORAL_TABLET | Freq: Four times a day (QID) | ORAL | Status: AC
  Administered 2019-01-24: 500 mg via ORAL
  Filled 2019-01-23: qty 1

## 2019-01-23 MED ORDER — METHOCARBAMOL 500 MG PO TABS
500.0000 mg | ORAL_TABLET | Freq: Four times a day (QID) | ORAL | Status: DC | PRN
  Administered 2019-01-25 – 2019-01-26 (×4): 500 mg via ORAL
  Filled 2019-01-23 (×5): qty 1

## 2019-01-23 MED ORDER — DEXAMETHASONE SODIUM PHOSPHATE 10 MG/ML IJ SOLN
INTRAMUSCULAR | Status: DC | PRN
  Administered 2019-01-23: 5 mg via INTRAVENOUS

## 2019-01-23 MED ORDER — PROPOFOL 10 MG/ML IV BOLUS
INTRAVENOUS | Status: DC | PRN
  Administered 2019-01-23: 150 mg via INTRAVENOUS

## 2019-01-23 MED ORDER — SODIUM CHLORIDE 0.9 % IR SOLN
Status: DC | PRN
  Administered 2019-01-23: 3000 mL

## 2019-01-23 MED ORDER — POLYETHYLENE GLYCOL 3350 17 G PO PACK
17.0000 g | PACK | Freq: Every day | ORAL | Status: DC | PRN
  Administered 2019-01-24 – 2019-01-25 (×2): 17 g via ORAL
  Filled 2019-01-23 (×2): qty 1

## 2019-01-23 MED ORDER — SUGAMMADEX SODIUM 200 MG/2ML IV SOLN
INTRAVENOUS | Status: DC | PRN
  Administered 2019-01-23: 200 mg via INTRAVENOUS

## 2019-01-23 MED ORDER — METHOCARBAMOL 1000 MG/10ML IJ SOLN
500.0000 mg | Freq: Four times a day (QID) | INTRAVENOUS | Status: DC | PRN
  Filled 2019-01-23: qty 5

## 2019-01-23 MED ORDER — FENTANYL CITRATE (PF) 250 MCG/5ML IJ SOLN
INTRAMUSCULAR | Status: AC
  Filled 2019-01-23: qty 5

## 2019-01-23 MED ORDER — CEFAZOLIN SODIUM-DEXTROSE 2-4 GM/100ML-% IV SOLN
2.0000 g | Freq: Four times a day (QID) | INTRAVENOUS | Status: AC
  Administered 2019-01-23 – 2019-01-24 (×3): 2 g via INTRAVENOUS
  Filled 2019-01-23 (×3): qty 100

## 2019-01-23 MED ORDER — PROPOFOL 10 MG/ML IV BOLUS
INTRAVENOUS | Status: AC
  Filled 2019-01-23: qty 20

## 2019-01-23 MED ORDER — SORBITOL 70 % SOLN: 30.0000 mL | Freq: Every day | Status: DC | PRN

## 2019-01-23 MED ORDER — VANCOMYCIN HCL 1 G IV SOLR
INTRAVENOUS | Status: DC | PRN
  Administered 2019-01-23: 1000 mg via TOPICAL

## 2019-01-23 MED ORDER — ENOXAPARIN SODIUM 40 MG/0.4ML ~~LOC~~ SOLN: 40.0000 mg | Freq: Every day | SUBCUTANEOUS | 13 refills | Status: AC

## 2019-01-23 MED ORDER — TRANEXAMIC ACID 1000 MG/10ML IV SOLN: 2000.0000 mg | INTRAVENOUS | Status: DC

## 2019-01-23 MED ORDER — HYDRALAZINE HCL 20 MG/ML IJ SOLN
INTRAMUSCULAR | Status: DC | PRN
  Administered 2019-01-23: 10 mg via INTRAVENOUS

## 2019-01-23 MED ORDER — DEXAMETHASONE SODIUM PHOSPHATE 10 MG/ML IJ SOLN
INTRAMUSCULAR | Status: AC
  Filled 2019-01-23: qty 1

## 2019-01-23 MED ORDER — MAGNESIUM CITRATE PO SOLN: 1.0000 | Freq: Once | ORAL | Status: DC | PRN

## 2019-01-23 MED ORDER — ONDANSETRON HCL 4 MG/2ML IJ SOLN
INTRAMUSCULAR | Status: DC | PRN
  Administered 2019-01-23: 4 mg via INTRAVENOUS

## 2019-01-23 MED ORDER — PREGABALIN 75 MG PO CAPS
75.0000 mg | ORAL_CAPSULE | Freq: Two times a day (BID) | ORAL | Status: DC
  Administered 2019-01-23 – 2019-01-26 (×7): 75 mg via ORAL
  Filled 2019-01-23 (×7): qty 1

## 2019-01-23 MED ORDER — FENTANYL CITRATE (PF) 100 MCG/2ML IJ SOLN
INTRAMUSCULAR | Status: DC | PRN
  Administered 2019-01-23: 100 ug via INTRAVENOUS
  Administered 2019-01-23: 50 ug via INTRAVENOUS
  Administered 2019-01-23: 100 ug via INTRAVENOUS

## 2019-01-23 MED ORDER — DOCUSATE SODIUM 100 MG PO CAPS
100.0000 mg | ORAL_CAPSULE | Freq: Two times a day (BID) | ORAL | Status: DC
  Administered 2019-01-23 – 2019-01-26 (×6): 100 mg via ORAL
  Filled 2019-01-23 (×6): qty 1

## 2019-01-23 MED ORDER — ENOXAPARIN SODIUM 40 MG/0.4ML ~~LOC~~ SOLN
40.0000 mg | SUBCUTANEOUS | Status: DC
  Administered 2019-01-24 – 2019-01-26 (×3): 40 mg via SUBCUTANEOUS
  Filled 2019-01-23 (×3): qty 0.4

## 2019-01-23 MED ORDER — ROCURONIUM BROMIDE 10 MG/ML (PF) SYRINGE
PREFILLED_SYRINGE | INTRAVENOUS | Status: DC | PRN
  Administered 2019-01-23: 10 mg via INTRAVENOUS
  Administered 2019-01-23: 50 mg via INTRAVENOUS
  Administered 2019-01-23: 20 mg via INTRAVENOUS

## 2019-01-23 MED ORDER — ALUM & MAG HYDROXIDE-SIMETH 200-200-20 MG/5ML PO SUSP: 30.0000 mL | ORAL | Status: DC | PRN

## 2019-01-23 MED ORDER — ONDANSETRON HCL 4 MG PO TABS: 4.0000 mg | ORAL_TABLET | Freq: Four times a day (QID) | ORAL | Status: DC | PRN

## 2019-01-23 MED ORDER — OXYCODONE-ACETAMINOPHEN 5-325 MG PO TABS: 1.0000 | ORAL_TABLET | Freq: Three times a day (TID) | ORAL | 0 refills | Status: DC | PRN

## 2019-01-23 MED ORDER — HYDROCODONE-ACETAMINOPHEN 5-325 MG PO TABS
1.0000 | ORAL_TABLET | ORAL | Status: DC | PRN
  Administered 2019-01-23 – 2019-01-26 (×9): 2 via ORAL
  Filled 2019-01-23 (×9): qty 2

## 2019-01-23 MED ORDER — VANCOMYCIN HCL 1000 MG IV SOLR
INTRAVENOUS | Status: AC
  Filled 2019-01-23: qty 1000

## 2019-01-23 MED ORDER — ENSURE MAX PROTEIN PO LIQD
11.0000 [oz_av] | Freq: Every day | ORAL | Status: DC
  Administered 2019-01-24 – 2019-01-26 (×2): 11 [oz_av] via ORAL
  Filled 2019-01-23 (×5): qty 330

## 2019-01-23 MED ORDER — MORPHINE SULFATE (PF) 2 MG/ML IV SOLN
1.0000 mg | INTRAVENOUS | Status: DC | PRN
  Administered 2019-01-25 – 2019-01-26 (×2): 1 mg via INTRAVENOUS
  Filled 2019-01-23: qty 1

## 2019-01-23 SURGICAL SUPPLY — 62 items
BAG DECANTER FOR FLEXI CONT (MISCELLANEOUS) ×3 IMPLANT
CELLS DAT CNTRL 66122 CELL SVR (MISCELLANEOUS) ×1 IMPLANT
COVER PERINEAL POST (MISCELLANEOUS) ×3 IMPLANT
COVER SURGICAL LIGHT HANDLE (MISCELLANEOUS) ×3 IMPLANT
COVER WAND RF STERILE (DRAPES) ×3 IMPLANT
CUP SECTOR GRIPTON 50MM (Cup) ×2 IMPLANT
DRAPE C-ARM 42X72 X-RAY (DRAPES) ×3 IMPLANT
DRAPE POUCH INSTRU U-SHP 10X18 (DRAPES) ×3 IMPLANT
DRAPE STERI IOBAN 125X83 (DRAPES) ×3 IMPLANT
DRAPE U-SHAPE 47X51 STRL (DRAPES) ×6 IMPLANT
DRSG AQUACEL AG ADV 3.5X10 (GAUZE/BANDAGES/DRESSINGS) ×3 IMPLANT
DRSG XEROFORM 1X8 (GAUZE/BANDAGES/DRESSINGS) ×2 IMPLANT
DURAPREP 26ML APPLICATOR (WOUND CARE) ×6 IMPLANT
ELECT BLADE 4.0 EZ CLEAN MEGAD (MISCELLANEOUS) ×3
ELECT REM PT RETURN 9FT ADLT (ELECTROSURGICAL) ×3
ELECTRODE BLDE 4.0 EZ CLN MEGD (MISCELLANEOUS) ×1 IMPLANT
ELECTRODE REM PT RTRN 9FT ADLT (ELECTROSURGICAL) ×1 IMPLANT
GLOVE BIOGEL PI IND STRL 7.0 (GLOVE) ×1 IMPLANT
GLOVE BIOGEL PI INDICATOR 7.0 (GLOVE) ×2
GLOVE ECLIPSE 7.0 STRL STRAW (GLOVE) ×6 IMPLANT
GLOVE SKINSENSE NS SZ7.5 (GLOVE) ×2
GLOVE SKINSENSE STRL SZ7.5 (GLOVE) ×1 IMPLANT
GLOVE SURG SYN 7.5  E (GLOVE) ×8
GLOVE SURG SYN 7.5 E (GLOVE) ×4 IMPLANT
GLOVE SURG SYN 7.5 PF PI (GLOVE) ×4 IMPLANT
GOWN STRL REIN XL XLG (GOWN DISPOSABLE) ×3 IMPLANT
GOWN STRL REUS W/ TWL LRG LVL3 (GOWN DISPOSABLE) IMPLANT
GOWN STRL REUS W/ TWL XL LVL3 (GOWN DISPOSABLE) ×1 IMPLANT
GOWN STRL REUS W/TWL LRG LVL3 (GOWN DISPOSABLE)
GOWN STRL REUS W/TWL XL LVL3 (GOWN DISPOSABLE) ×2
HANDPIECE INTERPULSE COAX TIP (DISPOSABLE) ×2
HEAD FEM STD 32X+1 STRL (Hips) ×2 IMPLANT
HOOD PEEL AWAY FLYTE STAYCOOL (MISCELLANEOUS) ×6 IMPLANT
IV NS IRRIG 3000ML ARTHROMATIC (IV SOLUTION) ×3 IMPLANT
KIT BASIN OR (CUSTOM PROCEDURE TRAY) ×3 IMPLANT
LINER ACET PNNCL PLUS4 NEUTRAL (Hips) IMPLANT
MARKER SKIN DUAL TIP RULER LAB (MISCELLANEOUS) ×3 IMPLANT
NDL SPNL 18GX3.5 QUINCKE PK (NEEDLE) ×1 IMPLANT
NEEDLE SPNL 18GX3.5 QUINCKE PK (NEEDLE) ×3 IMPLANT
PACK TOTAL JOINT (CUSTOM PROCEDURE TRAY) ×3 IMPLANT
PACK UNIVERSAL I (CUSTOM PROCEDURE TRAY) ×3 IMPLANT
PINNACLE PLUS 4 NEUTRAL (Hips) ×3 IMPLANT
RETRACTOR WND ALEXIS 18 MED (MISCELLANEOUS) IMPLANT
RTRCTR WOUND ALEXIS 18CM MED (MISCELLANEOUS) ×3
SAW OSC TIP CART 19.5X105X1.3 (SAW) ×3 IMPLANT
SCREW 6.5MMX25MM (Screw) ×2 IMPLANT
SET HNDPC FAN SPRY TIP SCT (DISPOSABLE) ×1 IMPLANT
STAPLER VISISTAT 35W (STAPLE) IMPLANT
STEM CORAIL KA12 (Stem) ×2 IMPLANT
SUT ETHIBOND 2 V 37 (SUTURE) ×3 IMPLANT
SUT VIC AB 0 CT1 27 (SUTURE) ×2
SUT VIC AB 0 CT1 27XBRD ANBCTR (SUTURE) ×1 IMPLANT
SUT VIC AB 1 CTX 36 (SUTURE) ×2
SUT VIC AB 1 CTX36XBRD ANBCTR (SUTURE) ×1 IMPLANT
SUT VIC AB 2-0 CT1 27 (SUTURE) ×4
SUT VIC AB 2-0 CT1 TAPERPNT 27 (SUTURE) ×2 IMPLANT
SYR 50ML LL SCALE MARK (SYRINGE) ×3 IMPLANT
TOWEL GREEN STERILE (TOWEL DISPOSABLE) ×3 IMPLANT
TRAY CATH 16FR W/PLASTIC CATH (SET/KITS/TRAYS/PACK) IMPLANT
TRAY FOLEY W/BAG SLVR 16FR (SET/KITS/TRAYS/PACK) ×2
TRAY FOLEY W/BAG SLVR 16FR ST (SET/KITS/TRAYS/PACK) ×1 IMPLANT
YANKAUER SUCT BULB TIP NO VENT (SUCTIONS) ×3 IMPLANT

## 2019-01-23 NOTE — Anesthesia Procedure Notes (Signed)
Procedure Name: Intubation Date/Time: 01/23/2019 9:55 AM Performed by: Moshe Salisbury, CRNA Pre-anesthesia Checklist: Patient identified, Emergency Drugs available, Suction available and Patient being monitored Patient Re-evaluated:Patient Re-evaluated prior to induction Oxygen Delivery Method: Circle System Utilized Preoxygenation: Pre-oxygenation with 100% oxygen Induction Type: IV induction Ventilation: Mask ventilation without difficulty Laryngoscope Size: Mac and 3 Grade View: Grade II Tube type: Oral Tube size: 7.0 mm Number of attempts: 1 Airway Equipment and Method: Stylet Placement Confirmation: ETT inserted through vocal cords under direct vision,  positive ETCO2 and breath sounds checked- equal and bilateral Secured at: 21 cm Tube secured with: Tape Dental Injury: Teeth and Oropharynx as per pre-operative assessment

## 2019-01-23 NOTE — Anesthesia Postprocedure Evaluation (Signed)
Anesthesia Post Note  Patient: Danielle Ayala  Procedure(s) Performed: TOTAL HIP ARTHROPLASTY ANTERIOR APPROACH (Right Hip)     Patient location during evaluation: PACU Anesthesia Type: General Level of consciousness: awake and alert Pain management: pain level controlled Vital Signs Assessment: post-procedure vital signs reviewed and stable Respiratory status: spontaneous breathing, nonlabored ventilation, respiratory function stable and patient connected to nasal cannula oxygen Cardiovascular status: blood pressure returned to baseline and stable Postop Assessment: no apparent nausea or vomiting Anesthetic complications: no    Last Vitals:  Vitals:   01/23/19 1200 01/23/19 1224  BP: (!) 149/69 (!) 145/57  Pulse: 87 67  Resp: 16 20  Temp:  36.4 C  SpO2: 93% 92%    Last Pain:  Vitals:   01/23/19 1224  TempSrc: Oral  PainSc: 2                  Effie Berkshire

## 2019-01-23 NOTE — Progress Notes (Signed)
PROGRESS NOTE    Danielle Ayala  QPY:[id number] DOB: [birth year] DOA: 01/22/2019 PCP: Jolene Provost, MD    Brief Narrative:  78 y.o. female with history of hypertension with history of left-sided nephrectomy for tumor, neck pain on Lyrica was brought to the ER after patient had a fall at [facility].  Patient states she thinks she may have tripped on something and fell.  Denies hitting head or losing consciousness.  Denies any chest pain or shortness of breath.  Since patient is having pain in the right hip area was brought to the ER.  Patient has had a recent left foot stress fracture for which patient is wearing a brace  Assessment & Plan:   Principal Problem:   Closed fracture of neck of right femur (HCC) Active Problems:   Essential hypertension   Normochromic normocytic anemia   Closed right hip fracture, initial encounter (HCC)  1. Right hip fracture status post mechanical fall  1. Orthopedic surgery consulted and pt is now s/p total R hip replacement 2. Follow up on therapy recs and continue analgesia as needed 2. Hypertension 1. BP stable at this time 2. Currently off ARB given below ARF 3. If renal function improves, consider resuming ARB 3. Acute renal failure with single kidney 1. Cr improved overnight 2. Will repeat bmet in AM 3. If renal function further improves, consider resuming ARB 4. Chronic anemia 1. Follow CBC trends 5. History of neck pain 1. Will continue on Lyrica   DVT prophylaxis: Lovenox subQ Code Status: Full Family Communication: Pt in room, family not at bedside Disposition Plan: Uncertain at this time  Consultants:   Orthopedic Surgery  Procedures:   R hip replacement 12/5  Antimicrobials: Anti-infectives (From admission, onward)   Start     Dose/Rate Route Frequency Ordered Stop   01/23/19 1500  ceFAZolin (ANCEF) IVPB 2g/100 mL premix     2 g 200 mL/hr over 30 Minutes Intravenous Every 6 hours 01/23/19 1226 01/24/19 0859   01/23/19  0923  vancomycin (VANCOCIN) powder  Status:  Discontinued       As needed 01/23/19 0924 01/23/19 1141   01/23/19 0600  ceFAZolin (ANCEF) IVPB 2g/100 mL premix     2 g 200 mL/hr over 30 Minutes Intravenous On call to O.R. 01/22/19 2142 01/23/19 0922   01/23/19 0600  ceFAZolin (ANCEF) IVPB 2g/100 mL premix  Status:  Discontinued     2 g 200 mL/hr over 30 Minutes Intravenous On call to O.R. 01/22/19 2142 01/22/19 2144       Subjective: Without complaints at this time  Objective: Vitals:   01/23/19 0756 01/23/19 1145 01/23/19 1200 01/23/19 1224  BP: (!) 169/80 (!) 154/73 (!) 149/69 (!) 145/57  Pulse: 74 80 87 67  Resp: 16 (!) 22 16 20   Temp: 98.5 F (36.9 C) (!) 97.2 F (36.2 C)  97.6 F (36.4 C)  TempSrc: Oral   Oral  SpO2: 98% 97% 93% 92%  Weight:      Height:        Intake/Output Summary (Last 24 hours) at 01/23/2019 1546 Last data filed at 01/23/2019 1139 Gross per 24 hour  Intake 1400 ml  Output 1250 ml  Net 150 ml   Filed Weights   01/22/19 1719  Weight: 81.6 kg    Examination:  General exam: Appears calm and comfortable  Respiratory system: Clear to auscultation. Respiratory effort normal. Cardiovascular system: S1 & S2 heard, Regular Gastrointestinal system: Abdomen is nondistended, soft and nontender. No  organomegaly or masses felt. Normal bowel sounds heard. Central nervous system: Alert and oriented. No focal neurological deficits. Extremities: Symmetric 5 x 5 power. Skin: No rashes, lesions Psychiatry: Judgement and insight appear normal. Mood & affect appropriate.   Data Reviewed: I have personally reviewed following labs and imaging studies  CBC: Recent Labs  Lab 01/22/19 1745 01/23/19 0514  WBC 10.8* 9.6  NEUTROABS 6.9  --   HGB 11.4* 11.4*  HCT 36.4 34.7*  MCV 96.0 92.8  PLT 217 188   Basic Metabolic Panel: Recent Labs  Lab 01/22/19 1745 01/23/19 0514  NA 143 138  K 4.0 4.2  CL 108 104  CO2 22 22  GLUCOSE 125* 147*  BUN 28* 22   CREATININE 1.35* 1.27*  CALCIUM 9.3 9.3   GFR: Estimated Creatinine Clearance: 39 mL/min (A) (by C-G formula based on SCr of 1.27 mg/dL (H)). Liver Function Tests: No results for input(s): AST, ALT, ALKPHOS, BILITOT, PROT, ALBUMIN in the last 168 hours. No results for input(s): LIPASE, AMYLASE in the last 168 hours. No results for input(s): AMMONIA in the last 168 hours. Coagulation Profile: No results for input(s): INR, PROTIME in the last 168 hours. Cardiac Enzymes: No results for input(s): CKTOTAL, CKMB, CKMBINDEX, TROPONINI in the last 168 hours. BNP (last 3 results) No results for input(s): PROBNP in the last 8760 hours. HbA1C: No results for input(s): HGBA1C in the last 72 hours. CBG: No results for input(s): GLUCAP in the last 168 hours. Lipid Profile: No results for input(s): CHOL, HDL, LDLCALC, TRIG, CHOLHDL, LDLDIRECT in the last 72 hours. Thyroid Function Tests: No results for input(s): TSH, T4TOTAL, FREET4, T3FREE, THYROIDAB in the last 72 hours. Anemia Panel: No results for input(s): VITAMINB12, FOLATE, FERRITIN, TIBC, IRON, RETICCTPCT in the last 72 hours. Sepsis Labs: No results for input(s): PROCALCITON, LATICACIDVEN in the last 168 hours.  Recent Results (from the past 240 hour(s))  SARS Coronavirus 2 by RT PCR (hospital order, performed in [facility] hospital lab) Nasopharyngeal Nasopharyngeal Swab     Status: None   Collection Time: 01/22/19  8:02 PM   Specimen: Nasopharyngeal Swab  Result Value Ref Range Status   SARS Coronavirus 2 NEGATIVE NEGATIVE Final    Comment: (NOTE) SARS-CoV-2 target nucleic acids are NOT DETECTED. The SARS-CoV-2 RNA is generally detectable in upper and lower respiratory specimens during the acute phase of infection. The lowest concentration of SARS-CoV-2 viral copies this assay can detect is 250 copies / mL. A negative result does not preclude SARS-CoV-2 infection and should not be used as the sole basis for treatment or  other patient management decisions.  A negative result may occur with improper specimen collection / handling, submission of specimen other than nasopharyngeal swab, presence of viral mutation(s) within the areas targeted by this assay, and inadequate number of viral copies (<250 copies / mL). A negative result must be combined with clinical observations, patient history, and epidemiological information. Fact Sheet for Patients:   BoilerBrush.com.cy Fact Sheet for Healthcare Providers: https://pope.com/ This test is not yet approved or cleared  by the [place] FDA and has been authorized for detection and/or diagnosis of SARS-CoV-2 by FDA under an Emergency Use Authorization (EUA).  This EUA will remain in effect (meaning this test can be used) for the duration of the COVID-19 declaration under Section 564(b)(1) of the Act, 21 U.S.C. section 360bbb-3(b)(1), unless the authorization is terminated or revoked sooner. Performed at [facility], [address] [place], [place] [address]  Surgical pcr screen     Status: None   Collection Time: 01/22/19 10:04 PM   Specimen: Nasal Mucosa; Nasal Swab  Result Value Ref Range Status   MRSA, PCR NEGATIVE NEGATIVE Final   Staphylococcus aureus NEGATIVE NEGATIVE Final    Comment: (NOTE) The Xpert SA Assay (FDA approved for NASAL specimens in patients 77 years of age and older), is one component of a comprehensive surveillance program. It is not intended to diagnose infection nor to guide or monitor treatment. Performed at [facility], [place] [address], [place], [place] [address]      Radiology Studies: Dg Chest 1 View  Result Date: 01/22/2019 CLINICAL DATA:  Preop EXAM: CHEST  1 VIEW COMPARISON:  None. FINDINGS: The heart size and mediastinal contours are within normal limits. Both lungs are clear. The visualized skeletal structures are unremarkable. IMPRESSION: No active  disease. Electronically Signed   By: Prudencio Pair M.D.   On: 01/22/2019 19:11   Pelvis Portable  Result Date: 01/23/2019 CLINICAL DATA:  Right femoral neck fracture, post right hip replacement EXAM: PORTABLE PELVIS 1-2 VIEWS COMPARISON:  Intraoperative radiographs-earlier same day; right hip radiographs-01/22/2019 FINDINGS: Post right total hip replacement. Alignment appears anatomic given AP projection. No additional fracture identified. Suspected mild-to-moderate degenerative change of the contralateral left hip. Degenerative change of pubic symphysis. Subcutaneous emphysema about the operative site. No radiopaque foreign body. IMPRESSION: Post right total hip replacement without evidence of complication. Electronically Signed   By: Sandi Mariscal M.D.   On: 01/23/2019 12:35   Dg C-arm 1-60 Min  Result Date: 01/23/2019 CLINICAL DATA:  Right femoral neck fracture. EXAM: DG C-ARM 1-60 MIN; OPERATIVE RIGHT HIP WITH PELVIS FLUOROSCOPY TIME:  35 seconds COMPARISON:  Right hip radiographs-01/22/2019 FINDINGS: 14 spot intraoperative fluoroscopic images of the right hip and lower pelvis are provided for review Provided images demonstrate the sequela of right total hip replacement. Alignment appears anatomic given AP projection. There is a minimal amount of expected subcutaneous emphysema about the operative site. No radiopaque foreign body. Limited visualization of the lower pelvis demonstrates mild degenerative change of the pubic symphysis. IMPRESSION: Post right total hip replacement without evidence of complication. Electronically Signed   By: Sandi Mariscal M.D.   On: 01/23/2019 12:33   Dg Hip Operative Unilat With Pelvis Right  Result Date: 01/23/2019 CLINICAL DATA:  Right femoral neck fracture. EXAM: DG C-ARM 1-60 MIN; OPERATIVE RIGHT HIP WITH PELVIS FLUOROSCOPY TIME:  35 seconds COMPARISON:  Right hip radiographs-01/22/2019 FINDINGS: 14 spot intraoperative fluoroscopic images of the right hip and lower  pelvis are provided for review Provided images demonstrate the sequela of right total hip replacement. Alignment appears anatomic given AP projection. There is a minimal amount of expected subcutaneous emphysema about the operative site. No radiopaque foreign body. Limited visualization of the lower pelvis demonstrates mild degenerative change of the pubic symphysis. IMPRESSION: Post right total hip replacement without evidence of complication. Electronically Signed   By: Sandi Mariscal M.D.   On: 01/23/2019 12:33   Dg Hip Unilat With Pelvis 2-3 Views Right  Result Date: 01/22/2019 CLINICAL DATA:  Fall from standing EXAM: DG HIP (WITH OR WITHOUT PELVIS) 2-3V RIGHT COMPARISON:  None. FINDINGS: There is a mildly displaced fracture of the right femoral neck with superior subluxation of the femoral shaft. The femoral head still articulates with the acetabulum. There also appears to be cortical disruption of the inferior left sacrum. Diffuse osteopenia seen. 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 IMPRESSION: Mildly  displaced right femoral neck fracture with superior subluxation of the femoral shaft. Question of a nondisplaced fracture of the left sacral ala. Electronically Signed   By: Jonna ClarkBindu  Avutu M.D.   On: 01/22/2019 19:11    Scheduled Meds: . acetaminophen  500 mg Oral Q6H  . docusate sodium  100 mg Oral BID  . [START ON 01/24/2019] enoxaparin (LOVENOX) injection  40 mg Subcutaneous Q24H  . multivitamin with minerals  1 tablet Oral Daily  . ondansetron      . povidone-iodine  2 application Topical Once  . pregabalin  75 mg Oral BID  . Ensure Max Protein  11 oz Oral Daily  . tranexamic acid (CYKLOKAPRON) topical -INTRAOP  2,000 mg Topical To OR   Continuous Infusions: . sodium chloride    .  ceFAZolin (ANCEF) IV 2 g (01/23/19 1437)  . lactated ringers 75 mL/hr at 01/23/19 0534  . methocarbamol (ROBAXIN) IV       LOS: 1 day   Rickey BarbaraStephen , MD [facility] Pager On Amion  If 7PM-7AM,  please contact night-coverage 01/23/2019, 3:46 PM

## 2019-01-23 NOTE — H&P (Signed)

## 2019-01-23 NOTE — Op Note (Signed)
TOTAL HIP ARTHROPLASTY ANTERIOR APPROACH  Procedure Note Danielle Ayala   [id number]  Pre-op Diagnosis: RIGHT FEMORAL NECK FRACTURE     Post-op Diagnosis: same   Operative Procedures  1. Total hip replacement; Right hip; uncemented [id number]   Personnel  Surgeon(s): Leandrew Koyanagi, MD  Assist: Madalyn Rob, PA-C; necessary for the timely completion of procedure and due to complexity of procedure.   Anesthesia: general  Prosthesis: Depuy Acetabulum: Pinnacle 50 mm Femur: Corail KA 12 Head: 32 mm size: +1 Liner: +4 neutral Bearing Type: metal on poly  Total Hip Arthroplasty (Anterior Approach) Op Note:  After informed consent was obtained and the operative extremity marked in the holding area, the patient was brought back to the operating room and placed supine on the HANA table. Next, the operative extremity was prepped and draped in normal sterile fashion. Surgical timeout occurred verifying patient identification, surgical site, surgical procedure and administration of antibiotics.  A modified anterior Smith-Peterson approach to the hip was performed, using the interval between tensor fascia lata and sartorius.  Dissection was carried bluntly down onto the anterior hip capsule. The lateral femoral circumflex vessels were identified and coagulated. A capsulotomy was performed and the capsular flaps tagged for later repair.  Fluoroscopy was utilized to prepare for the femoral neck cut. The neck osteotomy was performed. The femoral head was removed, the acetabular rim was cleared of soft tissue and attention was turned to reaming the acetabulum.  Sequential reaming was performed under fluoroscopic guidance. We reamed to a size 49 mm, and then impacted the acetabular shell. The liner was then placed after irrigation and attention turned to the femur.  After placing the femoral hook, the leg was taken to externally rotated, extended and adducted position taking care to perform soft  tissue releases to allow for adequate mobilization of the femur. Soft tissue was cleared from the shoulder of the greater trochanter and the hook elevator used to improve exposure of the proximal femur. Sequential broaching performed up to a size 12. Trial neck and head were placed. The leg was brought back up to neutral and the construct reduced. The position and sizing of components, offset and leg lengths were checked using fluoroscopy. Stability of the construct was checked in extension and external rotation without any subluxation or impingement of prosthesis. We dislocated the prosthesis, dropped the leg back into position, removed trial components, and irrigated copiously. The final stem and head was then placed, the leg brought back up, the system reduced and fluoroscopy used to verify positioning.  We irrigated, obtained hemostasis and closed the capsule using #2 ethibond suture.  One gram of vancomycin powder was placed in the surgical bed. The fascia was closed with #1 vicryl plus, the deep fat layer was closed with 0 vicryl, the subcutaneous layers closed with 2.0 Vicryl Plus and the skin closed with 2.0 nylon and dermabond. A sterile dressing was applied. The patient was awakened in the operating room and taken to recovery in stable condition.  All sponge, needle, and instrument counts were correct at the end of the case.   Position: supine  Complications: see description of procedure.  Time Out: performed   Drains/Packing: none  Estimated blood loss: see anesthesia record  Returned to Recovery Room: in good condition.   Antibiotics: yes   Mechanical VTE (DVT) Prophylaxis: sequential compression devices, TED thigh-high  Chemical VTE (DVT) Prophylaxis: lovenox   Fluid Replacement: see anesthesia record  Specimens Removed: 1 to pathology  Sponge and Instrument Count Correct? yes   PACU: portable radiograph - low AP   Plan/RTC: Return in 2 weeks for staple removal. Weight  Bearing/Load Lower Extremity: full  Hip precautions: none Suture Removal: 2 weeks   N. Glee Arvin, MD [facility] 11:21 AM   Implant Name Type Inv. Item Serial No. Manufacturer Lot No. LRB No. Used Action  SCREW 6.5MMX25MM - [id number] Screw SCREW 6.5MMX25MM  DEPUY SYNTHES [id number] Right 1 Implanted  SCREW 6.5MMX25MM - [id number] Screw SCREW 6.5MMX25MM  DEPUY SYNTHES [id number] Right 1 Wasted  PINNACLE PLUS 4 NEUTRAL - [id number] Hips PINNACLE PLUS 4 NEUTRAL  DEPUY SYNTHES [id number] Right 1 Implanted  CUP SECTOR GRIPTON - [id number] Cup CUP SECTOR GRIPTON  DEPUY SYNTHES [id number] Right 1 Implanted  HIP BALL ARTICU DEPUY - [id number] Hips HIP BALL ARTICU DEPUY  DEPUY SYNTHES [id number] Right 1 Implanted  STEM CORAIL KA12 - [id number] Stem STEM CORAIL KA12  DEPUY SYNTHES [id number] Right 1 Implanted

## 2019-01-23 NOTE — Transfer of Care (Signed)
Immediate Anesthesia Transfer of Care Note  Patient: Danielle Ayala  Procedure(s) Performed: TOTAL HIP ARTHROPLASTY ANTERIOR APPROACH (Right Hip)  Patient Location: PACU  Anesthesia Type:General  Level of Consciousness: drowsy and patient cooperative  Airway & Oxygen Therapy: Patient Spontanous Breathing and Patient connected to nasal cannula oxygen  Post-op Assessment: Report given to RN, Post -op Vital signs reviewed and stable and Patient moving all extremities  Post vital signs: Reviewed and stable  Last Vitals:  Vitals Value Taken Time  BP 154/73 01/23/19 1146  Temp    Pulse 80 01/23/19 1151  Resp 17 01/23/19 1151  SpO2 94 % 01/23/19 1151  Vitals shown include unvalidated device data.  Last Pain:  Vitals:   01/23/19 1145  TempSrc:   PainSc: (P) 6       Patients Stated Pain Goal: (P) 1 (08/67/61 9509)  Complications: No apparent anesthesia complications

## 2019-01-23 NOTE — Plan of Care (Signed)

## 2019-01-23 NOTE — Discharge Instructions (Signed)

## 2019-01-23 NOTE — Anesthesia Preprocedure Evaluation (Addendum)
Anesthesia Evaluation  Patient identified by MRN, date of birth, ID band Patient awake    Reviewed: Allergy & Precautions, NPO status , Patient's Chart, lab work & pertinent test results  Airway Mallampati: III  TM Distance: >3 FB Neck ROM: Full    Dental  (+) Teeth Intact, Dental Advisory Given   Pulmonary neg pulmonary ROS,    Pulmonary exam normal        Cardiovascular hypertension, Pt. on medications  Rhythm:Regular Rate:Normal     Neuro/Psych negative neurological ROS  negative psych ROS   GI/Hepatic negative GI ROS, Neg liver ROS,   Endo/Other  negative endocrine ROS  Renal/GU negative Renal ROS     Musculoskeletal negative musculoskeletal ROS (+)   Abdominal Normal abdominal exam  (+)   Peds  Hematology negative hematology ROS (+)   Anesthesia Other Findings   Reproductive/Obstetrics                            Anesthesia Physical Anesthesia Plan  ASA: II  Anesthesia Plan: Spinal   Post-op Pain Management:    Induction: Intravenous  PONV Risk Score and Plan: 3 and Ondansetron, Propofol infusion and Treatment may vary due to age or medical condition  Airway Management Planned: Natural Airway and Simple Face Mask  Additional Equipment: None  Intra-op Plan:   Post-operative Plan:   Informed Consent: I have reviewed the patients History and Physical, chart, labs and discussed the procedure including the risks, benefits and alternatives for the proposed anesthesia with the patient or authorized representative who has indicated his/her understanding and acceptance.       Plan Discussed with: CRNA  Anesthesia Plan Comments: (Lab Results      Component                Value               Date                      WBC                      9.6                 01/23/2019                HGB                      11.4 (L)            01/23/2019                HCT                       34.7 (L)            01/23/2019                MCV                      92.8                01/23/2019                PLT                      188  01/23/2019           )       Anesthesia Quick Evaluation

## 2019-01-23 NOTE — Progress Notes (Signed)
Initial Nutrition Assessment  DOCUMENTATION CODES:   Obesity unspecified  INTERVENTION:  -Ensure Max po once daily, each supplement provides 150 kcal and 30 grams of protein -MVI with minerals daily   NUTRITION DIAGNOSIS:   Increased nutrient needs related to wound healing(closed fracture to neck of right femur s/p surgery) as evidenced by estimated needs.  GOAL:   Patient will meet greater than or equal to 90% of their needs  MONITOR:   PO intake, Labs, I & O's, Weight trends, Supplement acceptance  REASON FOR ASSESSMENT:   Consult Hip fracture protocol  ASSESSMENT:  RD working remotely.  78 year old female with history of HTN, left-sided nephrectomy for tumor, chronic neck pain on Lyrica, and recent left foot stress fracture currently wearing a brace who presented to ED for evaluation of right hip pain after a fall at [facility]. X-ray showed right hip fracture.  Patient s/p total right hip replacement on 12/5  Unable to reach patient via phone this afternoon to obtain nutrition history. Patient diet advanced s/p surgery; no recorded meals at this time for review. Will continue to monitor for po intake and provide Ensure Max to aid with calorie/protein needs.   Current wt 81.6 kg (179.5 lb) non-pitting RLE edema per review of RN assessment. No weight history for review  Medications reviewed and include: Colace 100 mg BID, Ancef 2g IVBP, Lactated ringers 75 ml/r Labs: reviewed  NUTRITION - FOCUSED PHYSICAL EXAM: Unable to complete at this time, RD working remotely.  Diet Order:   Diet Order            Diet Carb Modified Fluid consistency: Thin; Room service appropriate? Yes  Diet effective now              EDUCATION NEEDS:   No education needs have been identified at this time  Skin:  Skin Assessment: Reviewed RN Assessment(incision; closed; right hip)  Last BM:  12/4  Height:   Ht Readings from Last 1 Encounters:  01/22/19 5\' 4"  (1.626 m)     Weight:   Wt Readings from Last 1 Encounters:  01/22/19 81.6 kg    Ideal Body Weight:  54.5 kg  BMI:  Body mass index is 30.9 kg/m.  Estimated Nutritional Needs:   Kcal:  1700-1850  Protein:  85-93  Fluid:  >/= 1.7 L/day   Lajuan Lines, RD, LDN Clinical Nutrition [facility] Telephone [phone] After Hours/Weekend Pager: [phone]

## 2019-01-24 ENCOUNTER — Encounter (HOSPITAL_COMMUNITY): Payer: Self-pay | Admitting: *Deleted

## 2019-01-24 LAB — BASIC METABOLIC PANEL
Anion gap: 8 (ref 5–15)
BUN: 21 mg/dL (ref 8–23)
CO2: 25 mmol/L (ref 22–32)
Calcium: 8.8 mg/dL — ABNORMAL LOW (ref 8.9–10.3)
Chloride: 104 mmol/L (ref 98–111)
Creatinine, Ser: 1.26 mg/dL — ABNORMAL HIGH (ref 0.44–1.00)
GFR calc Af Amer: 48 mL/min — ABNORMAL LOW (ref >60)
GFR calc non Af Amer: 41 mL/min — ABNORMAL LOW (ref >60)
Glucose, Bld: 127 mg/dL — ABNORMAL HIGH (ref 70–99)
Potassium: 4.1 mmol/L (ref 3.5–5.1)
Sodium: 137 mmol/L (ref 135–145)

## 2019-01-24 LAB — CBC
HCT: 31.1 % — ABNORMAL LOW (ref 36.0–46.0)
Hemoglobin: 10 g/dL — ABNORMAL LOW (ref 12.0–15.0)
MCH: 30.3 pg (ref 26.0–34.0)
MCHC: 32.2 g/dL (ref 30.0–36.0)
MCV: 94.2 fL (ref 80.0–100.0)
Platelets: 170 10*3/uL (ref 150–400)
RBC: 3.3 MIL/uL — ABNORMAL LOW (ref 3.87–5.11)
RDW: 14 % (ref 11.5–15.5)
WBC: 9.9 10*3/uL (ref 4.0–10.5)
nRBC: 0 % (ref 0.0–0.2)

## 2019-01-24 MED ORDER — LIDOCAINE 5 % EX PTCH
1.0000 | MEDICATED_PATCH | CUTANEOUS | Status: DC
  Administered 2019-01-24 – 2019-01-26 (×3): 1 via TRANSDERMAL
  Filled 2019-01-24 (×3): qty 1

## 2019-01-24 NOTE — Evaluation (Signed)
Occupational Therapy Evaluation Patient Details Name: Danielle FreshwaterVera Ayala MRN: [id number] DOB: 13-Jul-1940 Today's Date: 01/24/2019    History of Present Illness Pt is a 78 yo [facility] R femoral neck fx requiring R THA anterior approach. PMHx: HTN, neck pain, back sx.   Clinical Impression   Pt PTA: Pt living at home with spouse- spouse's main caregiver. Pt was independent with ADL and mobility with occasional AD. Pt currently limited by pain in R hip, decreased strength, decreased balance and decreased ability to care for self. Pt performing bed mobility with maxA; transfers with maxA for power-up for initial standing balance and ADL functional mobility with RW taking 8 steps from bed to recliner very slowly with decrease step height and reliance on RW. Pt tolerating set-upA for grooming in sitting and maxA for LB ADL at this time due to pain and poor ROM. Pt would greatly benefit from continued OT skilled services for ADL, mobility [facility], but may need to be SNF if family unable to physically assist pt. OT following acutely.     Follow Up Recommendations  Home health OT;Supervision/Assistance - 24 hour;SNF(may need SNF if does not progress. )    Equipment Recommendations  3 in 1 bedside commode;Wheelchair (measurements OT);Wheelchair cushion (measurements OT)(rent or buy w/c for community distances)    Recommendations for Other Services       Precautions / Restrictions Precautions Precautions: Fall;Anterior Hip Restrictions Weight Bearing Restrictions: Yes RLE Weight Bearing: Weight bearing as tolerated      Mobility Bed Mobility Overal bed mobility: Needs Assistance Bed Mobility: Rolling;Sidelying to Sit;Supine to Sit Rolling: Mod assist Sidelying to sit: Max assist;HOB elevated Supine to sit: Max assist;HOB elevated     General bed mobility comments: Pt attempting to assist with RLE, but pain too severe. Pt scooting to EOB by weight  shifting.  Transfers Overall transfer level: Needs assistance Equipment used: Rolling walker (2 wheeled) Transfers: Sit to/from [employer]Stand;Stand Pivot Transfers Sit to Stand: Max assist Stand pivot transfers: Mod assist       General transfer comment: maxA for power up; modA for transfers- pt taking small steps and sliding feet with maximal cueing to complete task.    Balance Overall balance assessment: Needs assistance Sitting-balance support: Bilateral upper extremity supported;Feet supported Sitting balance-Leahy Scale: Good     Standing balance support: Bilateral upper extremity supported Standing balance-Leahy Scale: Poor Standing balance comment: heavy reliance on RW                           ADL either performed or assessed with clinical judgement   ADL Overall ADL's : Needs assistance/impaired Eating/Feeding: Modified independent;Sitting Eating/Feeding Details (indicate cue type and reason): supported Grooming: Set up;Sitting   Upper Body Bathing: Set up;Sitting   Lower Body Bathing: Moderate assistance;+2 for physical assistance;+2 for safety/equipment;Cueing for safety;Sitting/lateral leans;Sit to/from stand   Upper Body Dressing : Set up;Sitting   Lower Body Dressing: Moderate assistance;+2 for physical assistance;+2 for safety/equipment;Cueing for safety;Cueing for sequencing;Sitting/lateral leans;Sit to/from stand   Toilet Transfer: Maximal assistance;Stand-pivot;Cueing for safety;Cueing for sequencing;BSC;RW Toilet Transfer Details (indicate cue type and reason): Pt taking small steps toward recliner Toileting- Clothing Manipulation and Hygiene: Moderate assistance;+2 for physical assistance;+2 for safety/equipment;Cueing for safety;Cueing for sequencing;Sitting/lateral lean;Sit to/from stand       Functional mobility during ADLs: Moderate assistance;Maximal assistance;Rolling walker;Cueing for safety General ADL Comments: Pt limited by pain in R hip,  decreased strength, decreased balance and decreased ability to care for self.     Vision Baseline Vision/History: No visual deficits Vision Assessment?: No apparent visual deficits     Perception     Praxis      Pertinent Vitals/Pain Pain Assessment: Faces Faces Pain Scale: Hurts even more Pain Location: R hip Pain Descriptors / Indicators: Aching;Discomfort;Grimacing Pain Intervention(s): Limited activity within patient's tolerance     Hand Dominance Right   Extremity/Trunk Assessment Upper Extremity Assessment Upper Extremity Assessment: Generalized weakness   Lower Extremity Assessment Lower Extremity Assessment: Generalized weakness RLE Deficits / Details: s/p hip sx   Cervical / Trunk Assessment Cervical / Trunk Assessment: Kyphotic   Communication Communication Communication: No difficulties   Cognition Arousal/Alertness: Awake/alert Behavior During Therapy: WFL for tasks assessed/performed Overall Cognitive Status: Within Functional Limits for tasks assessed                                     General Comments  VSS.  >93% SPO2 on RA. Pt really wants to return home with family to assist as pt's spouse requires assist and she does not want to leave him.    Exercises     Shoulder Instructions      Home Living Family/patient expects to be discharged to:: Private residence Living Arrangements: Spouse/significant other Available Help at Discharge: Family;Available PRN/intermittently Type of Home: House Home Access: Level entry     Home Layout: One level     Bathroom Shower/Tub: Occupational psychologist: Handicapped height Bathroom Accessibility: No   Home Equipment: Wheelchair - manual   Additional Comments: 1 Son in [place] can work from home; 1 son in [place]- works at [employer]- severe back problems- not able to physically assist; daughter is retired lives in [place]- could help intermittently.       Prior Functioning/Environment Level of Independence: Independent with assistive device(s)        Comments: SPC/RW in home; W/C availble for longer distance        OT Problem List: Decreased strength;Decreased activity tolerance;Impaired balance (sitting and/or standing);Decreased safety awareness;Pain      OT Treatment/Interventions: Self-care/ADL training;Therapeutic exercise;Energy conservation;Therapeutic activities;Patient/family education;Balance training;DME and/or AE instruction    OT Goals(Current goals can be found in the care plan section) Acute Rehab OT Goals Patient Stated Goal: to go home to spouse OT Goal Formulation: With patient Time For Goal Achievement: 02/07/19 Potential to Achieve Goals: Good ADL Goals Pt Will Perform Grooming: with min guard assist;standing Pt Will Perform Upper Body Dressing: with set-up;sitting Pt Will Perform Lower Body Dressing: with min assist;sitting/lateral leans;sit to/from stand;with adaptive equipment Pt Will Transfer to Toilet: with min assist;ambulating;bedside commode Pt Will Perform Toileting - Clothing Manipulation and hygiene: with min assist;with adaptive equipment;sitting/lateral leans;sit to/from stand Pt/caregiver will Perform Home Exercise Program: Increased strength;Both right and left upper extremity;With Supervision  OT Frequency: Min 2X/week   Barriers to D/C:            Co-evaluation              AM-PAC OT "6 Clicks" Daily Activity     Outcome Measure Help from another person eating meals?: None Help from another person taking care of personal grooming?: A Little Help from another person toileting, which includes using toliet, bedpan, or urinal?: A Lot Help from another person bathing (including washing, rinsing, drying)?: A Lot Help from another person  to put on and taking off regular upper body clothing?: A Little Help from another person to put on and taking off regular lower body clothing?: A Lot 6  Click Score: 16   End of Session Equipment Utilized During Treatment: Gait belt;Rolling walker Nurse Communication: Mobility status  Activity Tolerance: Patient tolerated treatment well;Patient limited by pain Patient left: in chair;with call bell/phone within reach;with chair alarm set  OT Visit Diagnosis: Unsteadiness on feet (R26.81);Muscle weakness (generalized) (M62.81);Pain Pain - Right/Left: Right Pain - part of body: Hip                Time: 6384-6659 OT Time Calculation (min): 39 min Charges:  OT General Charges $OT Visit: 1 Visit OT Evaluation $OT Eval Moderate Complexity: 1 Mod OT Treatments $Self Care/Home Management : 8-22 mins $Therapeutic Activity: 8-22 mins  Revonda Standard Cecil Cranker) Glendell Docker OTR/L Acute Rehabilitation Services Pager: [phone] Office: [phone]   Lonzo Cloud 01/24/2019, 3:19 PM

## 2019-01-24 NOTE — Plan of Care (Signed)
  Problem: Education: Goal: Knowledge of General Education information will improve Description: Including pain rating scale, medication(s)/side effects and non-pharmacologic comfort measures Outcome: Adequate for Discharge   

## 2019-01-24 NOTE — Progress Notes (Signed)
PROGRESS NOTE    Kiernan Farkas  FGH:[id number] DOB: 1940-04-22 DOA: 01/22/2019 PCP: Jolene Provost, MD    Brief Narrative:  78 y.o. female with history of hypertension with history of left-sided nephrectomy for tumor, neck pain on Lyrica was brought to the ER after patient had a fall at [facility].  Patient states she thinks she may have tripped on something and fell.  Denies hitting head or losing consciousness.  Denies any chest pain or shortness of breath.  Since patient is having pain in the right hip area was brought to the ER.  Patient has had a recent left foot stress fracture for which patient is wearing a brace  Assessment & Plan:   Principal Problem:   Closed fracture of neck of right femur (HCC) Active Problems:   Essential hypertension   Normochromic normocytic anemia   Closed right hip fracture, initial encounter (HCC)  1. Right hip fracture status post mechanical fall  1. Orthopedic surgery consulted and pt is now s/p total R hip replacement 2. Follow up on therapy recs and continue analgesia as needed 2. Hypertension 1. BP remains stable and controlled at this time 2. Currently off ARB given below ARF 3. If renal function improves, consider resuming ARB 3. Acute renal failure with single kidney 1. Cr stable 2. ARB remains on hold 3. Repeat bmet in AM 4. Chronic anemia 1. Continue to follow CBC trends 5. History of neck pain 1. Will continue on Lyrica  2. Pt reports taking Lidoderm patch prior to admit. Will order patch while here  DVT prophylaxis: Lovenox subQ Code Status: Full Family Communication: Pt in room, family not at bedside Disposition Plan: Uncertain at this time  Consultants:   Orthopedic Surgery  Procedures:   R hip replacement 12/5  Antimicrobials: Anti-infectives (From admission, onward)   Start     Dose/Rate Route Frequency Ordered Stop   01/23/19 1500  ceFAZolin (ANCEF) IVPB 2g/100 mL premix     2 g 200 mL/hr over 30 Minutes  Intravenous Every 6 hours 01/23/19 1226 01/24/19 0336   01/23/19 0923  vancomycin (VANCOCIN) powder  Status:  Discontinued       As needed 01/23/19 0924 01/23/19 1141   01/23/19 0600  ceFAZolin (ANCEF) IVPB 2g/100 mL premix     2 g 200 mL/hr over 30 Minutes Intravenous On call to O.R. 01/22/19 2142 01/23/19 0922   01/23/19 0600  ceFAZolin (ANCEF) IVPB 2g/100 mL premix  Status:  Discontinued     2 g 200 mL/hr over 30 Minutes Intravenous On call to O.R. 01/22/19 2142 01/22/19 2144      Subjective: Complaining of neck pain  Objective: Vitals:   01/24/19 0023 01/24/19 0357 01/24/19 0830 01/24/19 1408  BP: (!) 107/57 115/65 (!) 107/56 110/62  Pulse: 63 65 68 89  Resp:   15 17  Temp: 97.8 F (36.6 C) 98.1 F (36.7 C) 98.1 F (36.7 C) 98.6 F (37 C)  TempSrc: Oral Oral Oral Oral  SpO2: 92% 97% 95% 94%  Weight:      Height:        Intake/Output Summary (Last 24 hours) at 01/24/2019 1419 Last data filed at 01/24/2019 1300 Gross per 24 hour  Intake 635.9 ml  Output 1250 ml  Net -614.1 ml   Filed Weights   01/22/19 1719  Weight: 81.6 kg    Examination: General exam: Awake, laying in bed, in nad Respiratory system: Normal respiratory effort, no wheezing Cardiovascular system: regular rate, s1, s2 Gastrointestinal  system: Soft, nondistended, positive BS Central nervous system: CN2-12 grossly intact, strength intact Extremities: Perfused, no clubbing Skin: Normal skin turgor, no notable skin lesions seen Psychiatry: Mood normal // no visual hallucinations   Data Reviewed: I have personally reviewed following labs and imaging studies  CBC: Recent Labs  Lab 01/22/19 1745 01/23/19 0514 01/24/19 0549  WBC 10.8* 9.6 9.9  NEUTROABS 6.9  --   --   HGB 11.4* 11.4* 10.0*  HCT 36.4 34.7* 31.1*  MCV 96.0 92.8 94.2  PLT 217 188 170   Basic Metabolic Panel: Recent Labs  Lab 01/22/19 1745 01/23/19 0514 01/24/19 0549  NA 143 138 137  K 4.0 4.2 4.1  CL 108 104 104  CO2  22 22 25   GLUCOSE 125* 147* 127*  BUN 28* 22 21  CREATININE 1.35* 1.27* 1.26*  CALCIUM 9.3 9.3 8.8*   GFR: Estimated Creatinine Clearance: 39.3 mL/min (A) (by C-G formula based on SCr of 1.26 mg/dL (H)). Liver Function Tests: No results for input(s): AST, ALT, ALKPHOS, BILITOT, PROT, ALBUMIN in the last 168 hours. No results for input(s): LIPASE, AMYLASE in the last 168 hours. No results for input(s): AMMONIA in the last 168 hours. Coagulation Profile: No results for input(s): INR, PROTIME in the last 168 hours. Cardiac Enzymes: No results for input(s): CKTOTAL, CKMB, CKMBINDEX, TROPONINI in the last 168 hours. BNP (last 3 results) No results for input(s): PROBNP in the last 8760 hours. HbA1C: No results for input(s): HGBA1C in the last 72 hours. CBG: No results for input(s): GLUCAP in the last 168 hours. Lipid Profile: No results for input(s): CHOL, HDL, LDLCALC, TRIG, CHOLHDL, LDLDIRECT in the last 72 hours. Thyroid Function Tests: No results for input(s): TSH, T4TOTAL, FREET4, T3FREE, THYROIDAB in the last 72 hours. Anemia Panel: No results for input(s): VITAMINB12, FOLATE, FERRITIN, TIBC, IRON, RETICCTPCT in the last 72 hours. Sepsis Labs: No results for input(s): PROCALCITON, LATICACIDVEN in the last 168 hours.  Recent Results (from the past 240 hour(s))  SARS Coronavirus 2 by RT PCR (hospital order, performed [facility] lab) Nasopharyngeal Nasopharyngeal Swab     Status: None   Collection Time: 01/22/19  8:02 PM   Specimen: Nasopharyngeal Swab  Result Value Ref Range Status   SARS Coronavirus 2 NEGATIVE NEGATIVE Final    Comment: (NOTE) SARS-CoV-2 target nucleic acids are NOT DETECTED. The SARS-CoV-2 RNA is generally detectable in upper and lower respiratory specimens during the acute phase of infection. The lowest concentration of SARS-CoV-2 viral copies this assay can detect is 250 copies / mL. A negative result does not preclude SARS-CoV-2  infection and should not be used as the sole basis for treatment or other patient management decisions.  A negative result may occur with improper specimen collection / handling, submission of specimen other than nasopharyngeal swab, presence of viral mutation(s) within the areas targeted by this assay, and inadequate number of viral copies (<250 copies / mL). A negative result must be combined with clinical observations, patient history, and epidemiological information. Fact Sheet for Patients:   BoilerBrush.com.cyhttps://www.fda.gov/media/136312/download Fact Sheet for Healthcare Providers: https://pope.com/https://www.fda.gov/media/136313/download This test is not yet approved or cleared  by the [place] FDA and has been authorized for detection and/or diagnosis of SARS-CoV-2 by FDA under an Emergency Use Authorization (EUA).  This EUA will remain in effect (meaning this test can be used) for the duration of the COVID-19 declaration under Section 564(b)(1) of the Act, 21 U.S.C. section 360bbb-3(b)(1), unless the authorization is terminated or revoked  sooner. Performed at [facility], [place] [address] [place], [place] [address]   Surgical pcr screen     Status: None   Collection Time: 01/22/19 10:04 PM   Specimen: Nasal Mucosa; Nasal Swab  Result Value Ref Range Status   MRSA, PCR NEGATIVE NEGATIVE Final   Staphylococcus aureus NEGATIVE NEGATIVE Final    Comment: (NOTE) The Xpert SA Assay (FDA approved for NASAL specimens in patients 79 years of age and older), is one component of a comprehensive surveillance program. It is not intended to diagnose infection nor to guide or monitor treatment. Performed at [facility], [place] [address], [place], [place] [address]      Radiology Studies: Dg Chest 1 View  Result Date: 01/22/2019 CLINICAL DATA:  Preop EXAM: CHEST  1 VIEW COMPARISON:  None. FINDINGS: The heart size and mediastinal contours are within normal limits. Both lungs are clear. The  visualized skeletal structures are unremarkable. IMPRESSION: No active disease. Electronically Signed   By: Prudencio Pair M.D.   On: 01/22/2019 19:11   Pelvis Portable  Result Date: 01/23/2019 CLINICAL DATA:  Right femoral neck fracture, post right hip replacement EXAM: PORTABLE PELVIS 1-2 VIEWS COMPARISON:  Intraoperative radiographs-earlier same day; right hip radiographs-01/22/2019 FINDINGS: Post right total hip replacement. Alignment appears anatomic given AP projection. No additional fracture identified. Suspected mild-to-moderate degenerative change of the contralateral left hip. Degenerative change of pubic symphysis. Subcutaneous emphysema about the operative site. No radiopaque foreign body. IMPRESSION: Post right total hip replacement without evidence of complication. Electronically Signed   By: Sandi Mariscal M.D.   On: 01/23/2019 12:35   Dg C-arm 1-60 Min  Result Date: 01/23/2019 CLINICAL DATA:  Right femoral neck fracture. EXAM: DG C-ARM 1-60 MIN; OPERATIVE RIGHT HIP WITH PELVIS FLUOROSCOPY TIME:  35 seconds COMPARISON:  Right hip radiographs-01/22/2019 FINDINGS: 14 spot intraoperative fluoroscopic images of the right hip and lower pelvis are provided for review Provided images demonstrate the sequela of right total hip replacement. Alignment appears anatomic given AP projection. There is a minimal amount of expected subcutaneous emphysema about the operative site. No radiopaque foreign body. Limited visualization of the lower pelvis demonstrates mild degenerative change of the pubic symphysis. IMPRESSION: Post right total hip replacement without evidence of complication. Electronically Signed   By: Sandi Mariscal M.D.   On: 01/23/2019 12:33   Dg Hip Operative Unilat With Pelvis Right  Result Date: 01/23/2019 CLINICAL DATA:  Right femoral neck fracture. EXAM: DG C-ARM 1-60 MIN; OPERATIVE RIGHT HIP WITH PELVIS FLUOROSCOPY TIME:  35 seconds COMPARISON:  Right hip radiographs-01/22/2019 FINDINGS: 14  spot intraoperative fluoroscopic images of the right hip and lower pelvis are provided for review Provided images demonstrate the sequela of right total hip replacement. Alignment appears anatomic given AP projection. There is a minimal amount of expected subcutaneous emphysema about the operative site. No radiopaque foreign body. Limited visualization of the lower pelvis demonstrates mild degenerative change of the pubic symphysis. IMPRESSION: Post right total hip replacement without evidence of complication. Electronically Signed   By: Sandi Mariscal M.D.   On: 01/23/2019 12:33   Dg Hip Unilat With Pelvis 2-3 Views Right  Result Date: 01/22/2019 CLINICAL DATA:  Fall from standing EXAM: DG HIP (WITH OR WITHOUT PELVIS) 2-3V RIGHT COMPARISON:  None. FINDINGS: There is a mildly displaced fracture of the right femoral neck with superior subluxation of the femoral shaft. The femoral head still articulates with the acetabulum. There also appears to be cortical disruption of  the inferior left sacrum. Diffuse osteopenia seen. 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 IMPRESSION: Mildly displaced right femoral neck fracture with superior subluxation of the femoral shaft. Question of a nondisplaced fracture of the left sacral ala. Electronically Signed   By: Jonna Clark M.D.   On: 01/22/2019 19:11    Scheduled Meds: . docusate sodium  100 mg Oral BID  . enoxaparin (LOVENOX) injection  40 mg Subcutaneous Q24H  . lidocaine  1 patch Transdermal Q24H  . multivitamin with minerals  1 tablet Oral Daily  . povidone-iodine  2 application Topical Once  . pregabalin  75 mg Oral BID  . Ensure Max Protein  11 oz Oral Daily   Continuous Infusions: . sodium chloride Stopped (01/23/19 2143)  . methocarbamol (ROBAXIN) IV       LOS: 2 days   Rickey Barbara, MD [facility] Pager On Amion  If 7PM-7AM, please contact night-coverage 01/24/2019, 2:19 PM

## 2019-01-24 NOTE — Evaluation (Signed)
Physical Therapy Evaluation Patient Details Name: Danielle Ayala MRN: [id number] DOB: 03/31/1940 Today's Date: 01/24/2019   History of Present Illness  Pt is a 78 yo female s/p fall at [facility] resulting in R femoral neck fx requiring R THA anterior approach. PMHx: HTN, neck pain, back sx.    Clinical Impression  Pt admitted with above diagnosis. PTA pt lived at home with her husband, independent and active. On eval, she required mod/max assist bed mobility, mod assist sit to stand and mod assist SPT with RW. Unable to progress ambulation due to pain and fatigue. Unable to tolerate exercises/ROM RLE due to pain. Pt currently with functional limitations due to the deficits listed below (see PT Problem List). Pt will benefit from skilled PT to increase their independence and safety with mobility to allow discharge to the venue listed below.       Follow Up Recommendations SNF;Supervision/Assistance - 24 hour    Equipment Recommendations  Other (comment)(defer to next venue)    Recommendations for Other Services       Precautions / Restrictions Precautions Precautions: Fall Restrictions Weight Bearing Restrictions: Yes RLE Weight Bearing: Weight bearing as tolerated      Mobility  Bed Mobility Overal bed mobility: Needs Assistance Bed Mobility: Sit to Supine Rolling: Mod assist Sidelying to sit: Max assist;HOB elevated Supine to sit: Max assist;HOB elevated Sit to supine: Max assist;HOB elevated   General bed mobility comments: cues for sequencing. Significant increase in pain with transition of RLE into bed.  Transfers Overall transfer level: Needs assistance Equipment used: Rolling walker (2 wheeled) Transfers: Sit to/from [employer] Sit to Stand: Mod assist Stand pivot transfers: Mod assist       General transfer comment: assist to power up. Increased time to stabilize initial balance. Pt retropulsive. Several pivot steps with RW for transition recliner to  bed with max verbal cues for sequencing.  Ambulation/Gait                Stairs            Wheelchair Mobility    Modified Rankin (Stroke Patients Only)       Balance Overall balance assessment: Needs assistance Sitting-balance support: Bilateral upper extremity supported;Feet supported Sitting balance-Leahy Scale: Fair     Standing balance support: Bilateral upper extremity supported;During functional activity Standing balance-Leahy Scale: Poor Standing balance comment: heavy reliance on RW                             Pertinent Vitals/Pain Pain Assessment: 0-10 Pain Score: 5  Faces Pain Scale: Hurts even more Pain Location: R hip Pain Descriptors / Indicators: Guarding;Grimacing;Discomfort;Tender Pain Intervention(s): Limited activity within patient's tolerance;Repositioned    Home Living Family/patient expects to be discharged to:: Private residence Living Arrangements: Spouse/significant other Available Help at Discharge: Family;Available PRN/intermittently Type of Home: House Home Access: Level entry     Home Layout: One level Home Equipment: Wheelchair - [employer] - 2 wheels;Cane - single point Additional Comments: 1 Son in [place] can work from home; 1 son in [place]- works at [employer]- severe back problems- not able to physically assist; daughter is retired lives in [place]- could help intermittently.    Prior Function Level of Independence: Independent         Comments: Drives. No mobility issues until she started taken Lyrica several weeks ago. It made her unsteady resulting in fall.     Hand Dominance  Dominant Hand: Right    Extremity/Trunk Assessment   Upper Extremity Assessment Upper Extremity Assessment: Generalized weakness    Lower Extremity Assessment Lower Extremity Assessment: RLE deficits/detail RLE Deficits / Details: s/p THA RLE: Unable to fully assess due to pain    Cervical  / Trunk Assessment Cervical / Trunk Assessment: Kyphotic  Communication   Communication: No difficulties  Cognition Arousal/Alertness: Awake/alert Behavior During Therapy: WFL for tasks assessed/performed Overall Cognitive Status: Within Functional Limits for tasks assessed                                        General Comments General comments (skin integrity, edema, etc.): VSS.  >93% SPO2 on RA. Pt really wants to return home with family to assist as pt's spouse requires assist and she does not want to leave him.    Exercises     Assessment/Plan    PT Assessment Patient needs continued PT services  PT Problem List Decreased mobility;Decreased range of motion;Decreased strength;Decreased knowledge of precautions;Pain;Decreased balance;Decreased knowledge of use of DME;Decreased activity tolerance       PT Treatment Interventions DME instruction;Therapeutic activities;Gait training;Therapeutic exercise;Patient/family education;Balance training;Functional mobility training    PT Goals (Current goals can be found in the Care Plan section)  Acute Rehab PT Goals Patient Stated Goal: get better PT Goal Formulation: With patient/family Time For Goal Achievement: 02/07/19 Potential to Achieve Goals: Good    Frequency Min 5X/week   Barriers to discharge        Co-evaluation               AM-PAC PT "6 Clicks" Mobility  Outcome Measure Help needed turning from your back to your side while in a flat bed without using bedrails?: A Lot Help needed moving from lying on your back to sitting on the side of a flat bed without using bedrails?: A Lot Help needed moving to and from a bed to a chair (including a wheelchair)?: A Lot Help needed standing up from a chair using your arms (e.g., wheelchair or bedside chair)?: A Lot Help needed to walk in hospital room?: A Lot Help needed climbing 3-5 steps with a railing? : Total 6 Click Score: 11    End of Session  Equipment Utilized During Treatment: Gait belt Activity Tolerance: Patient limited by pain Patient left: in bed;with call bell/phone within reach;with family/visitor present;with SCD's reapplied Nurse Communication: Mobility status PT Visit Diagnosis: Pain;Muscle weakness (generalized) (M62.81);Other abnormalities of gait and mobility (R26.89);Difficulty in walking, not elsewhere classified (R26.2) Pain - Right/Left: Right Pain - part of body: Hip    Time: 7106-2694 PT Time Calculation (min) (ACUTE ONLY): 39 min   Charges:   PT Evaluation $PT Eval Moderate Complexity: 1 Mod PT Treatments $Gait Training: 8-22 mins $Therapeutic Activity: 8-22 mins        Lorrin Goodell, PT  Office # [phone] Pager [phone]   Danielle Ayala 01/24/2019, 4:06 PM

## 2019-01-25 ENCOUNTER — Encounter (HOSPITAL_COMMUNITY): Payer: Self-pay | Admitting: Orthopaedic Surgery

## 2019-01-25 DIAGNOSIS — Z419 Encounter for procedure for purposes other than remedying health state, unspecified: Secondary | ICD-10-CM

## 2019-01-25 LAB — BASIC METABOLIC PANEL
Anion gap: 6 (ref 5–15)
BUN: 25 mg/dL — ABNORMAL HIGH (ref 8–23)
CO2: 26 mmol/L (ref 22–32)
Calcium: 8.6 mg/dL — ABNORMAL LOW (ref 8.9–10.3)
Chloride: 108 mmol/L (ref 98–111)
Creatinine, Ser: 1.35 mg/dL — ABNORMAL HIGH (ref 0.44–1.00)
GFR calc Af Amer: 44 mL/min — ABNORMAL LOW (ref >60)
GFR calc non Af Amer: 38 mL/min — ABNORMAL LOW (ref >60)
Glucose, Bld: 157 mg/dL — ABNORMAL HIGH (ref 70–99)
Potassium: 3.5 mmol/L (ref 3.5–5.1)
Sodium: 140 mmol/L (ref 135–145)

## 2019-01-25 LAB — CBC
HCT: 27.1 % — ABNORMAL LOW (ref 36.0–46.0)
Hemoglobin: 8.9 g/dL — ABNORMAL LOW (ref 12.0–15.0)
MCH: 30.7 pg (ref 26.0–34.0)
MCHC: 32.8 g/dL (ref 30.0–36.0)
MCV: 93.4 fL (ref 80.0–100.0)
Platelets: 148 10*3/uL — ABNORMAL LOW (ref 150–400)
RBC: 2.9 MIL/uL — ABNORMAL LOW (ref 3.87–5.11)
RDW: 13.7 % (ref 11.5–15.5)
WBC: 8.8 10*3/uL (ref 4.0–10.5)
nRBC: 0 % (ref 0.0–0.2)

## 2019-01-25 NOTE — Progress Notes (Signed)
Physical Therapy Treatment Patient Details Name: Danielle Ayala MRN: [id number] DOB: Sep 07, 1940 Today's Date: 01/25/2019    History of Present Illness Pt is a 78 yo female s/p fall at [facility] resulting in R femoral neck fx requiring R THA anterior approach. PMHx: HTN, neck pain, back sx.    PT Comments    Pt pleasant, talkative and improving with gait and transfers this session. Pt able to walk short distance with chair follow and perform assisted HEP. Pt educated for transfers, mobility progression and encouraged to use [facility] with nursing during the day not the purewick.    Follow Up Recommendations  SNF;Supervision/Assistance - 24 hour     Equipment Recommendations  None recommended by PT    Recommendations for Other Services       Precautions / Restrictions Precautions Precautions: Fall Restrictions RLE Weight Bearing: Weight bearing as tolerated    Mobility  Bed Mobility Overal bed mobility: Needs Assistance Bed Mobility: Supine to Sit     Supine to sit: Mod assist;HOB elevated     General bed mobility comments: HOB elevated with mod assist to fully pivot to EOB with cues for bridging with LLE to scoot pelvis toward EOB to assist with transition and assist to elevate trunk  Transfers Overall transfer level: Needs assistance   Transfers: Sit to/from Stand;Stand Pivot Transfers Sit to Stand: Min assist Stand pivot transfers: Mod assist       General transfer comment: min assist to stand from bed with cues for hand placement, mod assist to pivot with RW toward Left then min assist to stand from [facility]  Ambulation/Gait Ambulation/Gait assistance: Min guard Gait Distance (Feet): 15 Feet Assistive device: Rolling walker (2 wheeled) Gait Pattern/deviations: Step-to pattern;Decreased stride length   Gait velocity interpretation: 1.31 - 2.62 ft/sec, indicative of limited community ambulator General Gait Details: pt initially with cues and increased time to advance RLE, with  increased distance improved advancement with chair pulled behind. Cues for sequence, safety and RW use   Stairs             Wheelchair Mobility    Modified Rankin (Stroke Patients Only)       Balance Overall balance assessment: Needs assistance Sitting-balance support: Bilateral upper extremity supported;Feet supported Sitting balance-Leahy Scale: Fair     Standing balance support: Bilateral upper extremity supported;During functional activity Standing balance-Leahy Scale: Poor Standing balance comment: heavy reliance on RW                            Cognition Arousal/Alertness: Awake/alert Behavior During Therapy: WFL for tasks assessed/performed Overall Cognitive Status: Within Functional Limits for tasks assessed                                        Exercises General Exercises - Lower Extremity Long Arc QuadSinclair Ship;Right;Seated;10 reps Hip Flexion/Marching: AAROM;Right;Seated;10 reps    General Comments        Pertinent Vitals/Pain      Home Living                      Prior Function            PT Goals (current goals can now be found in the care plan section) Progress towards PT goals: Progressing toward goals    Frequency    Min 5X/week  PT Plan Current plan remains appropriate    Co-evaluation              AM-PAC PT "6 Clicks" Mobility   Outcome Measure  Help needed turning from your back to your side while in a flat bed without using bedrails?: A Lot Help needed moving from lying on your back to sitting on the side of a flat bed without using bedrails?: A Lot Help needed moving to and from a bed to a chair (including a wheelchair)?: A Lot Help needed standing up from a chair using your arms (e.g., wheelchair or bedside chair)?: A Lot Help needed to walk in hospital room?: A Lot Help needed climbing 3-5 steps with a railing? : Total 6 Click Score: 11    End of Session Equipment  Utilized During Treatment: Gait belt Activity Tolerance: Patient tolerated treatment well Patient left: in chair;with call bell/phone within reach;with chair alarm set Nurse Communication: Mobility status PT Visit Diagnosis: Pain;Muscle weakness (generalized) (M62.81);Other abnormalities of gait and mobility (R26.89) Pain - Right/Left: Right Pain - part of body: Hip     Time: 0349-1791 PT Time Calculation (min) (ACUTE ONLY): 31 min  Charges:  $Gait Training: 8-22 mins $Therapeutic Exercise: 8-22 mins                     Markeisha Mancias P, PT Acute Rehabilitation Services Pager: [phone] Office: [phone]    Lamekia Nolden B Catelyn Friel 01/25/2019, 1:37 PM

## 2019-01-25 NOTE — TOC Initial Note (Signed)
Transition of Care [facility]) - Initial/Assessment Note    Patient Details  Name: Danielle Ayala MRN: [id number] Date of Birth: Jul 21, 1940  Transition of Care [facility]) CM/SW Contact:    Atilano Median, LCSW Phone Number: 01/25/2019, 1:44 PM  Clinical Narrative:                 CSW spoke with patient regarding her disposition. PT recommending SNF. Patient is aware and agreeable to this plan. Information sent out to SNF for review. CSW will review bed offers with patient once they are received.   Disposition is SNF pending bed offers and medical stability.   Expected Discharge Plan: Skilled Nursing Facility Barriers to Discharge: Continued Medical Work up   Patient Goals and CMS Choice Patient states their goals for this hospitalization and ongoing recovery are:: Get stronger so I can go home with my husband CMS Medicare.gov Compare Post Acute Care list provided to:: Patient Choice offered to / list presented to : Patient  Expected Discharge Plan and Services Expected Discharge Plan: [place] In-house Referral: Clinical Social Work   Post Acute Care Choice: [place]                                        Prior Living Arrangements/Services   Lives with:: Spouse          Need for Family Participation in Patient Care: Yes (Comment) Care giver support system in place?: Yes (comment)   Criminal Activity/Legal Involvement Pertinent to Current Situation/Hospitalization: No - Comment as needed  Activities of Daily Living Home Assistive Devices/Equipment: Other (Comment) ADL Screening (condition at time of admission) Patient's cognitive ability adequate to safely complete daily activities?: Yes Is the patient deaf or have difficulty hearing?: No Does the patient have difficulty seeing, even when wearing glasses/contacts?: No Does the patient have difficulty concentrating, remembering, or making decisions?: No Patient able to express need for  assistance with ADLs?: Yes Does the patient have difficulty dressing or bathing?: Yes Independently performs ADLs?: No Does the patient have difficulty walking or climbing stairs?: Yes Weakness of Legs: Right Weakness of Arms/Hands: None  Permission Sought/Granted                  Emotional Assessment       Orientation: : Oriented to Situation, Oriented to  Time, Oriented to Place, Oriented to Self      Admission diagnosis:  Closed fracture of neck of right femur, initial encounter ([place]) [S72.001A] Fall, initial encounter [W19.XXXA] Patient Active Problem List   Diagnosis Date Noted  . Closed fracture of neck of right femur ([place]) 01/22/2019  . Essential hypertension 01/22/2019  . Normochromic normocytic anemia 01/22/2019  . Closed right hip fracture, initial encounter ([place]) 01/22/2019   PCP:  Karlene Einstein, MD Pharmacy:   [place], [place] - [address] [place] [address] Phone: [phone] Fax: [phone]     Social Determinants of Health (SDOH) Interventions    Readmission Risk Interventions No flowsheet data found.

## 2019-01-25 NOTE — Progress Notes (Signed)
Subjective: 2 Days Post-Op Procedure(s) (LRB): TOTAL HIP ARTHROPLASTY ANTERIOR APPROACH (Right) Patient reports pain as mild.    Objective: Vital signs in last 24 hours: Temp:  [98.6 F (37 C)-100.1 F (37.8 C)] 100.1 F (37.8 C) (12/07 0822) Pulse Rate:  [65-89] 80 (12/07 0822) Resp:  [15-18] 15 (12/07 0822) BP: (110-135)/(50-62) 135/54 (12/07 0822) SpO2:  [92 %-96 %] 95 % (12/07 0822)  Intake/Output from previous day: 12/06 0701 - 12/07 0700 In: 900 [P.O.:900] Out: 1250 [Urine:1250] Intake/Output this shift: No intake/output data recorded.  Recent Labs    01/22/19 1745 01/23/19 0514 01/24/19 0549 01/25/19 0609  HGB 11.4* 11.4* 10.0* 8.9*   Recent Labs    01/24/19 0549 01/25/19 0609  WBC 9.9 8.8  RBC 3.30* 2.90*  HCT 31.1* 27.1*  PLT 170 148*   Recent Labs    01/24/19 0549 01/25/19 0609  NA 137 140  K 4.1 3.5  CL 104 108  CO2 25 26  BUN 21 25*  CREATININE 1.26* 1.35*  GLUCOSE 127* 157*  CALCIUM 8.8* 8.6*   No results for input(s): LABPT, INR in the last 72 hours.  Neurologically intact Neurovascular intact Sensation intact distally Intact pulses distally Dorsiflexion/Plantar flexion intact Incision: dressing C/D/I No cellulitis present Compartment soft   Assessment/Plan: 2 Days Post-Op Procedure(s) (LRB): TOTAL HIP ARTHROPLASTY ANTERIOR APPROACH (Right) Up with therapy  WBAT RLE ABLA-mild and stable Lovenox 40mg  daily x 2 weeks for dvt ppx Dry dressing change prn F/u with Dr. Erlinda Hong in two weeks      Danielle Ayala 01/25/2019, 10:01 AM

## 2019-01-25 NOTE — Progress Notes (Signed)
Occupational Therapy Treatment Patient Details Name: Danielle Ayala MRN: [id number] DOB: [birth year] Today's Date: 01/25/2019    History of present illness Pt is a 78 yo female s/p fall at [facility] resulting in R femoral neck fx requiring R THA anterior approach. PMHx: HTN, neck pain, back sx.   OT comments  Pt making progress with functional goals. Pt transferred from recliner to [facility] mod - min A, toileting tasks min A. Pt ambulated x 3 feet back to bed with mod A sit - supine. OT will continue to follow acutely  Follow Up Recommendations  Home health OT;Supervision/Assistance - 24 hour;SNF    Equipment Recommendations  3 in 1 bedside commode;Wheelchair (measurements OT);Wheelchair cushion (measurements OT)    Recommendations for Other Services      Precautions / Restrictions Precautions Precautions: Fall Restrictions RLE Weight Bearing: Weight bearing as tolerated       Mobility Bed Mobility Overal bed mobility: Needs Assistance Bed Mobility: Sit to Supine     Supine to sit: Mod assist;HOB elevated Sit to supine: Mod assist   General bed mobility comments: pt in recliner upon arrival. Assisted her back to bed at end of session, mod A with LEs onto bed  Transfers Overall transfer level: Needs assistance Equipment used: Rolling walker (2 wheeled) Transfers: Sit to/from [facility] Sit to Stand: Min assist Stand pivot transfers: Min assist       General transfer comment: min assist to stand from recliner with cues for hand placement, min/mod assist to pivot with RW to [facility] and back to ned    Balance Overall balance assessment: Needs assistance Sitting-balance support: Bilateral upper extremity supported;Feet supported Sitting balance-Leahy Scale: Fair     Standing balance support: Bilateral upper extremity supported;During functional activity Standing balance-Leahy Scale: Poor Standing balance comment: heavy reliance on RW                            ADL either performed or assessed with clinical judgement   ADL Overall ADL's : Needs assistance/impaired     Grooming: Set up;Sitting           Upper Body Dressing : Set up;Sitting   Lower Body Dressing: Moderate assistance;Cueing for safety;Cueing for sequencing;Sitting/lateral leans;Sit to/from stand   Toilet Transfer: Moderate assistance;Minimal assistance;RW;Stand-pivot;BSC;Cueing for safety;Cueing for sequencing   Toileting- Clothing Manipulation and Hygiene: Minimal assistance;Sitting/lateral lean;Sit to/from stand       Functional mobility during ADLs: Moderate assistance;Minimal assistance;Rolling walker;Cueing for safety       Vision Baseline Vision/History: No visual deficits Vision Assessment?: No apparent visual deficits   Perception     Praxis      Cognition Arousal/Alertness: Awake/alert Behavior During Therapy: WFL for tasks assessed/performed Overall Cognitive Status: Within Functional Limits for tasks assessed                                 General Comments: pt very talkative and required redirection for task continuation        Exercises General Exercises - Lower Extremity Long Arc Quad: AAROM;Right;Seated;10 reps Hip Flexion/Marching: AAROM;Right;Seated;10 reps   Shoulder Instructions       General Comments      Pertinent Vitals/ Pain       Pain Assessment: 0-10 Pain Score: 4  Pain Location: R hip Pain Descriptors / Indicators: Guarding;Grimacing;Discomfort;Tender Pain Intervention(s): Limited activity within patient's tolerance;Monitored during session;Repositioned;Premedicated before session  Home Living                                          Prior Functioning/Environment              Frequency  Min 2X/week        Progress Toward Goals  OT Goals(current goals can now be found in the care plan section)  Progress towards OT goals: Progressing toward goals  Acute Rehab OT  Goals Patient Stated Goal: go home  Plan      Co-evaluation                 AM-PAC OT "6 Clicks" Daily Activity     Outcome Measure   Help from another person eating meals?: None   Help from another person toileting, which includes using toliet, bedpan, or urinal?: A Little Help from another person bathing (including washing, rinsing, drying)?: A Lot Help from another person to put on and taking off regular upper body clothing?: A Little Help from another person to put on and taking off regular lower body clothing?: A Lot 6 Click Score: 14    End of Session Equipment Utilized During Treatment: Gait belt;Rolling walker;Other (comment)(BSC)  OT Visit Diagnosis: Unsteadiness on feet (R26.81);Muscle weakness (generalized) (M62.81);Pain Pain - Right/Left: Right Pain - part of body: Hip   Activity Tolerance Patient tolerated treatment well;Patient limited by pain   Patient Left with call bell/phone within reach;in bed;with bed alarm set   Nurse Communication          Time: 0076-2263 OT Time Calculation (min): 49 min  Charges: OT General Charges $OT Visit: 1 Visit OT Treatments $Self Care/Home Management : 23-37 mins $Therapeutic Activity: 8-22 mins     Galen Manila 01/25/2019, 2:00 PM

## 2019-01-25 NOTE — Progress Notes (Signed)
PROGRESS NOTE    Danielle Ayala  IPJ:[id number] DOB: 10-Apr-1940 DOA: 01/22/2019 PCP: Danielle Provost, MD    Brief Narrative:  78 y.o. female with history of hypertension with history of left-sided nephrectomy for tumor, neck pain on Lyrica was brought to the ER after patient had a fall at [facility].  Patient states she thinks she may have tripped on something and fell.  Denies hitting head or losing consciousness.  Denies any chest pain or shortness of breath.  Since patient is having pain in the right hip area was brought to the ER.  Patient has had a recent left foot stress fracture for which patient is wearing a brace  Assessment & Plan:   Principal Problem:   Closed fracture of neck of right femur (HCC) Active Problems:   Essential hypertension   Normochromic normocytic anemia   Closed right hip fracture, initial encounter (HCC)  1. Right hip fracture status post mechanical fall  1. Orthopedic surgery consulted and pt is now s/p total R hip replacement 2. Follow up on therapy recs and continue analgesia as needed 3. Recommendations for SNF initially, however pt wanted to go home instead to care for her husband. Pt now understands that she is much weaker and would benefit from SNF instead 4. SW following for placement 2. Hypertension 1. BP remains stable and controlled at this time 2. Currently off ARB given below ARF 3. If renal function improves, consider resuming ARB 3. Acute renal failure with single kidney 1. Cr remains stable 2. ARB remains on hold 3. Repeat bmet in AM 4. Chronic anemia 1. Continue to follow CBC trends 5. History of neck pain 1. Will continue on Lyrica  2. Pt reports taking Lidoderm patch prior to admit. Will order patch while here 3. Continue with above analgesics as needed  DVT prophylaxis: Lovenox subQ Code Status: Full Family Communication: Pt in room, family not at bedside Disposition Plan: Uncertain at this time  Consultants:   Orthopedic  Surgery  Procedures:   R hip replacement 12/5  Antimicrobials: Anti-infectives (From admission, onward)   Start     Dose/Rate Route Frequency Ordered Stop   01/23/19 1500  ceFAZolin (ANCEF) IVPB 2g/100 mL premix     2 g 200 mL/hr over 30 Minutes Intravenous Every 6 hours 01/23/19 1226 01/24/19 0336   01/23/19 0923  vancomycin (VANCOCIN) powder  Status:  Discontinued       As needed 01/23/19 0924 01/23/19 1141   01/23/19 0600  ceFAZolin (ANCEF) IVPB 2g/100 mL premix     2 g 200 mL/hr over 30 Minutes Intravenous On call to O.R. 01/22/19 2142 01/23/19 0922   01/23/19 0600  ceFAZolin (ANCEF) IVPB 2g/100 mL premix  Status:  Discontinued     2 g 200 mL/hr over 30 Minutes Intravenous On call to O.R. 01/22/19 2142 01/22/19 2144      Subjective: Complaining of continued neck pain.  Objective: Vitals:   01/25/19 0200 01/25/19 0434 01/25/19 0822 01/25/19 1350  BP:  (!) 119/52 (!) 135/54 (!) 146/76  Pulse:  65 80 88  Resp: 18  15 16   Temp:  98.9 F (37.2 C) 100.1 F (37.8 C) 97.6 F (36.4 C)  TempSrc:  Oral Oral Oral  SpO2:  96% 95% 98%  Weight:      Height:        Intake/Output Summary (Last 24 hours) at 01/25/2019 1633 Last data filed at 01/25/2019 0600 Gross per 24 hour  Intake 540 ml  Output 300  ml  Net 240 ml   Filed Weights   01/22/19 1719  Weight: 81.6 kg    Examination: General exam: Conversant, in no acute distress Respiratory system: normal chest rise, clear, no audible wheezing Cardiovascular system: regular rhythm, s1-s2 Gastrointestinal system: Nondistended, nontender, pos BS Central nervous system: No seizures, no tremors Extremities: No cyanosis, no joint deformities Skin: No rashes, no pallor Psychiatry: Affect normal // no auditory hallucinations   Data Reviewed: I have personally reviewed following labs and imaging studies  CBC: Recent Labs  Lab 01/22/19 1745 01/23/19 0514 01/24/19 0549 01/25/19 0609  WBC 10.8* 9.6 9.9 8.8  NEUTROABS 6.9   --   --   --   HGB 11.4* 11.4* 10.0* 8.9*  HCT 36.4 34.7* 31.1* 27.1*  MCV 96.0 92.8 94.2 93.4  PLT 217 188 170 536*   Basic Metabolic Panel: Recent Labs  Lab 01/22/19 1745 01/23/19 0514 01/24/19 0549 01/25/19 0609  NA 143 138 137 140  K 4.0 4.2 4.1 3.5  CL 108 104 104 108  CO2 22 22 25 26   GLUCOSE 125* 147* 127* 157*  BUN 28* 22 21 25*  CREATININE 1.35* 1.27* 1.26* 1.35*  CALCIUM 9.3 9.3 8.8* 8.6*   GFR: Estimated Creatinine Clearance: 36.7 mL/min (A) (by C-G formula based on SCr of 1.35 mg/dL (H)). Liver Function Tests: No results for input(s): AST, ALT, ALKPHOS, BILITOT, PROT, ALBUMIN in the last 168 hours. No results for input(s): LIPASE, AMYLASE in the last 168 hours. No results for input(s): AMMONIA in the last 168 hours. Coagulation Profile: No results for input(s): INR, PROTIME in the last 168 hours. Cardiac Enzymes: No results for input(s): CKTOTAL, CKMB, CKMBINDEX, TROPONINI in the last 168 hours. BNP (last 3 results) No results for input(s): PROBNP in the last 8760 hours. HbA1C: No results for input(s): HGBA1C in the last 72 hours. CBG: No results for input(s): GLUCAP in the last 168 hours. Lipid Profile: No results for input(s): CHOL, HDL, LDLCALC, TRIG, CHOLHDL, LDLDIRECT in the last 72 hours. Thyroid Function Tests: No results for input(s): TSH, T4TOTAL, FREET4, T3FREE, THYROIDAB in the last 72 hours. Anemia Panel: No results for input(s): VITAMINB12, FOLATE, FERRITIN, TIBC, IRON, RETICCTPCT in the last 72 hours. Sepsis Labs: No results for input(s): PROCALCITON, LATICACIDVEN in the last 168 hours.  Recent Results (from the past 240 hour(s))  SARS Coronavirus 2 by RT PCR (hospital order, performed in [facility] hospital lab) Nasopharyngeal Nasopharyngeal Swab     Status: None   Collection Time: 01/22/19  8:02 PM   Specimen: Nasopharyngeal Swab  Result Value Ref Range Status   SARS Coronavirus 2 NEGATIVE NEGATIVE Final    Comment: (NOTE) SARS-CoV-2  target nucleic acids are NOT DETECTED. The SARS-CoV-2 RNA is generally detectable in upper and lower respiratory specimens during the acute phase of infection. The lowest concentration of SARS-CoV-2 viral copies this assay can detect is 250 copies / mL. A negative result does not preclude SARS-CoV-2 infection and should not be used as the sole basis for treatment or other patient management decisions.  A negative result may occur with improper specimen collection / handling, submission of specimen other than nasopharyngeal swab, presence of viral mutation(s) within the areas targeted by this assay, and inadequate number of viral copies (<250 copies / mL). A negative result must be combined with clinical observations, patient history, and epidemiological information. Fact Sheet for Patients:   StrictlyIdeas.no Fact Sheet for Healthcare Providers: BankingDealers.co.za This test is not yet approved or cleared  by the [place] FDA and has been authorized for detection and/or diagnosis of SARS-CoV-2 by FDA under an Emergency Use Authorization (EUA).  This EUA will remain in effect (meaning this test can be used) for the duration of the COVID-19 declaration under Section 564(b)(1) of the Act, 21 U.S.C. section 360bbb-3(b)(1), unless the authorization is terminated or revoked sooner. Performed at [facility], [address] [place], [place] [address]   Surgical pcr screen     Status: None   Collection Time: 01/22/19 10:04 PM   Specimen: Nasal Mucosa; Nasal Swab  Result Value Ref Range Status   MRSA, PCR NEGATIVE NEGATIVE Final   Staphylococcus aureus NEGATIVE NEGATIVE Final    Comment: (NOTE) The Xpert SA Assay (FDA approved for NASAL specimens in patients 76 years of age and older), is one component of a comprehensive surveillance program. It is not intended to diagnose infection nor to guide or monitor treatment. Performed at  [facility], [address] [place], [place] [address]      Radiology Studies: No results found.  Scheduled Meds: . docusate sodium  100 mg Oral BID  . enoxaparin (LOVENOX) injection  40 mg Subcutaneous Q24H  . lidocaine  1 patch Transdermal Q24H  . multivitamin with minerals  1 tablet Oral Daily  . povidone-iodine  2 application Topical Once  . pregabalin  75 mg Oral BID  . Ensure Max Protein  11 oz Oral Daily   Continuous Infusions: . sodium chloride 10 mL/hr at 01/24/19 1900  . methocarbamol (ROBAXIN) IV       LOS: 3 days   Rickey BarbaraStephen Kaidence Sant, MD [facility] Pager On Amion  If 7PM-7AM, please contact night-coverage 01/25/2019, 4:33 PM

## 2019-01-25 NOTE — [place] FL2 (Signed)
[place] MEDICAID FL2 LEVEL OF CARE SCREENING TOOL     IDENTIFICATION  Patient Name: Danielle Ayala Birthdate: 10-06-1940 Sex: female Admission Date (Current Location): 01/22/2019  [facility] and [place] Number:      Facility and Address:  [facility], [address] [place], [place] [address]      Provider Number:    Attending Physician Name and Address:  Jerald Kief, MD  Relative Name and Phone Number:  Earlie Counts [phone]    Current Level of Care: Hospital Recommended Level of Care: Skilled Nursing Facility Prior Approval Number:    Date Approved/Denied:   PASRR Number: [id number]  Discharge Plan: SNF    Current Diagnoses: Patient Active Problem List   Diagnosis Date Noted  . Closed fracture of neck of right femur (HCC) 01/22/2019  . Essential hypertension 01/22/2019  . Normochromic normocytic anemia 01/22/2019  . Closed right hip fracture, initial encounter (HCC) 01/22/2019    Orientation RESPIRATION BLADDER Height & Weight     Self, Time, Situation, Place  Normal Continent Weight: 180 lb (81.6 kg) Height:  5\' 4"  (162.6 cm)  BEHAVIORAL SYMPTOMS/MOOD NEUROLOGICAL BOWEL NUTRITION STATUS      Continent Diet(see discharge summary)  AMBULATORY STATUS COMMUNICATION OF NEEDS Skin   Extensive Assist Verbally Surgical wounds                       Personal Care Assistance Level of Assistance  Bathing, Dressing Bathing Assistance: Maximum assistance   Dressing Assistance: Maximum assistance     Functional Limitations Info             SPECIAL CARE FACTORS FREQUENCY  PT (By licensed PT), OT (By licensed OT)     PT Frequency: 5 times a week OT Frequency: 5 times a week            Contractures      Additional Factors Info  Code Status Code Status Info: full             Current Medications (01/25/2019):  This is the current hospital active medication list Current Facility-Administered Medications   Medication Dose Route Frequency Provider Last Rate Last Dose  . 0.9 %  sodium chloride infusion   Intravenous Continuous 14/08/2018, MD 10 mL/hr at 01/24/19 1900    . acetaminophen (TYLENOL) tablet 325-650 mg  325-650 mg Oral Q6H PRN 14/06/20, MD      . alum & mag hydroxide-simeth (MAALOX/MYLANTA) 200-200-20 MG/5ML suspension 30 mL  30 mL Oral Q4H PRN 08-27-2000, MD      . docusate sodium (COLACE) capsule 100 mg  100 mg Oral BID Tarry Kos, MD   100 mg at 01/25/19 0830  . enoxaparin (LOVENOX) injection 40 mg  40 mg Subcutaneous Q24H 14/07/20, MD   40 mg at 01/25/19 14/07/20  . hydrALAZINE (APRESOLINE) injection 10 mg  10 mg Intravenous Q4H PRN 4481, MD      . HYDROcodone-acetaminophen (NORCO) 7.5-325 MG per tablet 1-2 tablet  1-2 tablet Oral Q4H PRN 06-18-1976, MD      . HYDROcodone-acetaminophen (NORCO/VICODIN) 5-325 MG per tablet 1-2 tablet  1-2 tablet Oral Q4H PRN Tarry Kos, MD   2 tablet at 01/25/19 14/07/20  . lidocaine (LIDODERM) 5 % 1 patch  1 patch Transdermal Q24H 8563, MD   1 patch at 01/25/19 0830  . magnesium citrate solution 1 Bottle  1  Bottle Oral Once PRN Leandrew Koyanagi, MD      . menthol-cetylpyridinium (CEPACOL) lozenge 3 mg  1 lozenge Oral PRN Leandrew Koyanagi, MD       Or  . phenol (CHLORASEPTIC) mouth spray 1 spray  1 spray Mouth/Throat PRN Leandrew Koyanagi, MD      . methocarbamol (ROBAXIN) tablet 500 mg  500 mg Oral Q6H PRN Leandrew Koyanagi, MD   500 mg at 01/25/19 0316   Or  . methocarbamol (ROBAXIN) 500 mg in dextrose 5 % 50 mL IVPB  500 mg Intravenous Q6H PRN Leandrew Koyanagi, MD      . morphine 2 MG/ML injection 0.5 mg  0.5 mg Intravenous Q2H PRN Leandrew Koyanagi, MD   0.5 mg at 01/23/19 8099  . morphine 2 MG/ML injection 1 mg  1 mg Intravenous Q2H PRN Leandrew Koyanagi, MD      . multivitamin with minerals tablet 1 tablet  1 tablet Oral Daily Donne Hazel, MD   1 tablet at 01/25/19 [phone]  . ondansetron (ZOFRAN) tablet 4 mg  4 mg Oral Q6H PRN Leandrew Koyanagi, MD       Or  . ondansetron [facility]) injection 4 mg  4 mg Intravenous Q6H PRN Leandrew Koyanagi, MD   4 mg at 01/23/19 1200  . polyethylene glycol (MIRALAX / GLYCOLAX) packet 17 g  17 g Oral Daily PRN Leandrew Koyanagi, MD   17 g at 01/25/19 0830  . povidone-iodine 10 % swab 2 application  2 application Topical Once Leandrew Koyanagi, MD      . pregabalin (LYRICA) capsule 75 mg  75 mg Oral BID Donne Hazel, MD   75 mg at 01/25/19 0829  . protein supplement (ENSURE MAX) liquid  11 oz Oral Daily Donne Hazel, MD   11 oz at 01/24/19 1002  . sorbitol 70 % solution 30 mL  30 mL Oral Daily PRN Leandrew Koyanagi, MD         Discharge Medications: Please see discharge summary for a list of discharge medications.  Relevant Imaging Results:  Relevant Lab Results:   Additional Information [id number] [place], [place]

## 2019-01-25 NOTE — Progress Notes (Signed)
Pt. Had severe pain in her posterior neck earlier in the evening 8/10. Pain was relieved by vicodin and lyrica. Pt was afraid to move because of neck pain when she was ambulating from the bed to the [facility].

## 2019-01-25 NOTE — Progress Notes (Signed)
Attention [employer]   Please follow up with Roselee Nova [phone] for insurance authorization.

## 2019-01-25 NOTE — Plan of Care (Signed)
  Problem: Activity: Goal: Risk for activity intolerance will decrease Outcome: Progressing   Problem: Pain Managment: Goal: General experience of comfort will improve Outcome: Progressing   Problem: Safety: Goal: Ability to remain free from injury will improve Outcome: Progressing   

## 2019-01-26 ENCOUNTER — Inpatient Hospital Stay (HOSPITAL_COMMUNITY): Payer: Medicare Other

## 2019-01-26 LAB — BASIC METABOLIC PANEL
Anion gap: 7 (ref 5–15)
BUN: 22 mg/dL (ref 8–23)
CO2: 26 mmol/L (ref 22–32)
Calcium: 8.5 mg/dL — ABNORMAL LOW (ref 8.9–10.3)
Chloride: 106 mmol/L (ref 98–111)
Creatinine, Ser: 1.18 mg/dL — ABNORMAL HIGH (ref 0.44–1.00)
GFR calc Af Amer: 52 mL/min — ABNORMAL LOW (ref >60)
GFR calc non Af Amer: 45 mL/min — ABNORMAL LOW (ref >60)
Glucose, Bld: 136 mg/dL — ABNORMAL HIGH (ref 70–99)
Potassium: 4.2 mmol/L (ref 3.5–5.1)
Sodium: 139 mmol/L (ref 135–145)

## 2019-01-26 LAB — CBC
HCT: 26.2 % — ABNORMAL LOW (ref 36.0–46.0)
Hemoglobin: 8.6 g/dL — ABNORMAL LOW (ref 12.0–15.0)
MCH: 30.8 pg (ref 26.0–34.0)
MCHC: 32.8 g/dL (ref 30.0–36.0)
MCV: 93.9 fL (ref 80.0–100.0)
Platelets: 157 10*3/uL (ref 150–400)
RBC: 2.79 MIL/uL — ABNORMAL LOW (ref 3.87–5.11)
RDW: 13.8 % (ref 11.5–15.5)
WBC: 8 10*3/uL (ref 4.0–10.5)
nRBC: 0 % (ref 0.0–0.2)

## 2019-01-26 LAB — SARS CORONAVIRUS 2 (TAT 6-24 HRS): SARS Coronavirus 2: NEGATIVE

## 2019-01-26 MED ORDER — LOSARTAN POTASSIUM 50 MG PO TABS: 50.0000 mg | ORAL_TABLET | Freq: Every day | ORAL | 0 refills | Status: DC

## 2019-01-26 NOTE — TOC Transition Note (Signed)
Transition of Care [facility]) - CM/SW Discharge Note   Patient Details  Name: Danielle Ayala MRN: [id number] Date of Birth: 06-Aug-1940  Transition of Care [facility]) CM/SW Contact:  Atilano Median, LCSW Phone Number: 01/26/2019, 12:36 PM   Clinical Narrative:    Discharged to SNF. Referral coordinated with Lexine Baton [phone]. Patient aware and agreeable to this plan. Number to call report [phone] given to unit RN Marcene Brawn. No other needs at this time. Case closed to this CSW.    Final next level of care: Skilled Nursing Facility Barriers to Discharge: Barriers Resolved   Patient Goals and CMS Choice Patient states their goals for this hospitalization and ongoing recovery are:: Get stronger so I can go home with my husband CMS Medicare.gov Compare Post Acute Care list provided to:: Patient Choice offered to / list presented to : Patient  Discharge Placement PASRR number recieved: 01/25/19            Patient chooses bed at: [facility] Patient to be transferred to facility by: [place] Name of family member notified: Malachy Mood Patient and family notified of of transfer: 01/26/19  Discharge Plan and Services In-house Referral: Clinical Social Work   Post Acute Care Choice: [place]                               Social Determinants of Health (SDOH) Interventions     Readmission Risk Interventions No flowsheet data found.

## 2019-01-26 NOTE — Progress Notes (Signed)
Physical Therapy Treatment Patient Details Name: Danielle Ayala MRN: [id number] DOB: October 09, 1940 Today's Date: 01/26/2019    History of Present Illness Pt is a 78 yo female s/Ayala fall at [facility] resulting in R femoral neck fx requiring R THA anterior approach. PMHx: HTN, neck pain, back sx.    PT Comments    Pt pleasant and reporting left neck pain has gotten worse from repeated transfers particularly toward right of bed. Pt with improved transfer and decreased pain with exit to left of bed. Pt with improving transfers overall and increased gait distance. Pt encouraged for BSC use not purewick. SNF remains appropriate.    Follow Up Recommendations  SNF;Supervision/Assistance - 24 hour     Equipment Recommendations  None recommended by PT    Recommendations for Other Services       Precautions / Restrictions Precautions Precautions: Fall Restrictions RLE Weight Bearing: Weight bearing as tolerated    Mobility  Bed Mobility Overal bed mobility: Needs Assistance Bed Mobility: Supine to Sit     Supine to sit: Mod assist;HOB elevated     General bed mobility comments: pt reports increased left pain with transfers and transitioned to exit toward left of bed to prevent pt from pulling on rail with LUE. Pt with cues for sequence with assist to move RLE, elevate trunk and scoot to edge but pt reporting increased comfort with this technique  Transfers Overall transfer level: Needs assistance   Transfers: Sit to/from Stand;Stand Pivot Transfers Sit to Stand: Min assist;Mod assist Stand pivot transfers: Min assist       General transfer comment: pt with mod assist for initial stand from bed with cues for hand placement, min assist with RW increaed time and cues to pivot to [facility]. Min assist to stand from [facility]  Ambulation/Gait Ambulation/Gait assistance: Min guard Gait Distance (Feet): 30 Feet Assistive device: Rolling walker (2 wheeled) Gait Pattern/deviations: Step-to  pattern;Decreased stride length   Gait velocity interpretation: >2.62 ft/sec, indicative of community ambulatory General Gait Details: cues for sequence with initial difficulty advancing RLE with increased step length with progression. pt with cues for safety and RW use with ability to increase distance this session   Stairs             Wheelchair Mobility    Modified Rankin (Stroke Patients Only)       Balance Overall balance assessment: Needs assistance Sitting-balance support: Bilateral upper extremity supported;Feet supported Sitting balance-Leahy Scale: Fair     Standing balance support: Bilateral upper extremity supported;During functional activity Standing balance-Leahy Scale: Poor Standing balance comment: heavy reliance on RW                            Cognition Arousal/Alertness: Awake/alert Behavior During Therapy: WFL for tasks assessed/performed Overall Cognitive Status: Within Functional Limits for tasks assessed                                 General Comments: pt talkative but able to be redirected      Exercises General Exercises - Lower Extremity Long Arc Quad: AAROM;Right;Seated;10 reps Hip Flexion/Marching: AAROM;Right;Seated;10 reps    General Comments        Pertinent Vitals/Pain Pain Score: 5  Pain Location: Rt hip with movement Pain Descriptors / Indicators: Aching;Sore Pain Intervention(s): Limited activity within patient's tolerance;Monitored during session;Repositioned    Home Living  Prior Function            PT Goals (current goals can now be found in the care plan section) Progress towards PT goals: Progressing toward goals    Frequency    Min 5X/week      PT Plan Current plan remains appropriate    Co-evaluation              AM-PAC PT "6 Clicks" Mobility   Outcome Measure  Help needed turning from your back to your side while in a flat bed without  using bedrails?: A Lot Help needed moving from lying on your back to sitting on the side of a flat bed without using bedrails?: A Lot Help needed moving to and from a bed to a chair (including a wheelchair)?: A Lot Help needed standing up from a chair using your arms (e.g., wheelchair or bedside chair)?: A Lot Help needed to walk in hospital room?: A Little Help needed climbing 3-5 steps with a railing? : A Lot 6 Click Score: 13    End of Session Equipment Utilized During Treatment: Gait belt Activity Tolerance: Patient tolerated treatment well Patient left: in chair;with call bell/phone within reach;with chair alarm set Nurse Communication: Mobility status PT Visit Diagnosis: Pain;Muscle weakness (generalized) (M62.81);Other abnormalities of gait and mobility (R26.89)     Time: 7035-0093 PT Time Calculation (min) (ACUTE ONLY): 31 min  Charges:  $Gait Training: 8-22 mins $Therapeutic Exercise: 8-22 mins                     Danielle Ayala, PT Acute Rehabilitation Services Pager: [phone] Office: [phone]    Danielle Ayala 01/26/2019, 1:30 PM

## 2019-01-26 NOTE — Discharge Summary (Signed)
Physician Discharge Summary  Danielle Ayala YQM:[id number] DOB: [birth year] DOA: 01/22/2019  PCP: Jolene ProvostHaimes, David M, MD  Admit date: 01/22/2019 Discharge date: 01/26/2019  Admitted From: Home Disposition:  SNF  Recommendations for Outpatient Follow-up:  1. Follow up with PCP in 1-2 weeks 2. Follow up with Orthopedic Surgery as scheduled  Discharge Condition:Stable CODE STATUS:Full Diet recommendation: Regular   Brief/Interim Summary: 78 y.o.femalewithhistory of hypertension with history of left-sided nephrectomy for tumor, neck pain on Lyrica was brought to the ER [facility] Patient states she thinks she may have tripped on something and fell. Denies hitting head or losing consciousness. Denies any chest pain or shortness of breath. Since patient is having pain in the right hip area was brought to the ER.  Discharge Diagnoses:  Principal Problem:   Closed fracture of neck of right femur (HCC) Active Problems:   Essential hypertension   Normochromic normocytic anemia   Closed right hip fracture, initial encounter (HCC)  1. Right hip fracture status post mechanical fall  1. Orthopedic surgery consulted and pt is now s/p total R hip replacement 2. Follow up on therapy recs and continue analgesia as needed 3. Recommendations for SNF, pt is agreeable 4. Analgesia per Orthopedic Surgery 5. Orthopedic Surgery to follow up in 2 weeks 2. Hypertension 1. BP remains stable and controlled at this time 2. Had been off ARB given below ARF 3. Renal function improved. Will resume cozaar at lower dose of 50mg  3. Acute renal failure with single kidney 1. Cr remains stable 2. ARB had been on hold while in hospital 3. Renal function improved. Have decreased ARB dose to 50mg  4. Chronic anemia 1. Remained stable 5. History of neck pain 1. Chronic 2. Will continue on Lyrica  3. Pt reports taking Lidoderm patch prior to admit. Given lidoderm patch while in  hospital 4. Continue with above analgesics as needed 5. Cervical xray reviewed and was neg for fractures  Discharge Instructions  Discharge Instructions    Weight bearing as tolerated   Complete by: As directed      Allergies as of 01/26/2019      Reactions   Cleocin [clindamycin Hcl] Rash   Cholesterol    Cholesterol medications make her sick      Medication List    TAKE these medications   docusate sodium 100 MG capsule Commonly known as: COLACE Take 100 mg by mouth daily.   enoxaparin 40 MG/0.4ML injection Commonly known as: LOVENOX Inject 0.4 mLs (40 mg total) into the skin daily.   losartan 50 MG tablet Commonly known as: Cozaar Take 1 tablet (50 mg total) by mouth daily. What changed:   medication strength  how much to take   oxyCODONE-acetaminophen 5-325 MG tablet Commonly known as: Percocet Take 1-2 tablets by mouth every 8 (eight) hours as needed for severe pain.   pregabalin 150 MG capsule Commonly known as: LYRICA Take 75 mg by mouth 2 (two) times daily.            Discharge Care Instructions  (From admission, onward)         Start     Ordered   01/23/19 0000  Weight bearing as tolerated     01/23/19 0856          Contact information for follow-up providers    Tarry KosXu, Naiping M, MD. Schedule an appointment as soon as possible for a visit in 2 week(s).   Specialty: Orthopedic Surgery Contact information: [phone] [place]  [address] [place] [address] [phone]            Contact information for after-discharge care    Destination    [facility] Preferred SNF .   Service: Skilled Nursing Contact information: [address] [place] [address] [phone]                 Allergies  Allergen Reactions  . Cleocin [Clindamycin Hcl] Rash  . Cholesterol     Cholesterol medications make her sick    Consultations:  Orthopedic Surgery  Procedures/Studies: Dg Chest 1 View  Result  Date: 01/22/2019 CLINICAL DATA:  Preop EXAM: CHEST  1 VIEW COMPARISON:  None. FINDINGS: The heart size and mediastinal contours are within normal limits. Both lungs are clear. The visualized skeletal structures are unremarkable. IMPRESSION: No active disease. Electronically Signed   By: Jonna Clark M.D.   On: 01/22/2019 19:11   Pelvis Portable  Result Date: 01/23/2019 CLINICAL DATA:  Right femoral neck fracture, post right hip replacement EXAM: PORTABLE PELVIS 1-2 VIEWS COMPARISON:  Intraoperative radiographs-earlier same day; right hip radiographs-01/22/2019 FINDINGS: Post right total hip replacement. Alignment appears anatomic given AP projection. No additional fracture identified. Suspected mild-to-moderate degenerative change of the contralateral left hip. Degenerative change of pubic symphysis. Subcutaneous emphysema about the operative site. No radiopaque foreign body. IMPRESSION: Post right total hip replacement without evidence of complication. Electronically Signed   By: Simonne Come M.D.   On: 01/23/2019 12:35   Dg C-arm 1-60 Min  Result Date: 01/23/2019 CLINICAL DATA:  Right femoral neck fracture. EXAM: DG C-ARM 1-60 MIN; OPERATIVE RIGHT HIP WITH PELVIS FLUOROSCOPY TIME:  35 seconds COMPARISON:  Right hip radiographs-01/22/2019 FINDINGS: 14 spot intraoperative fluoroscopic images of the right hip and lower pelvis are provided for review Provided images demonstrate the sequela of right total hip replacement. Alignment appears anatomic given AP projection. There is a minimal amount of expected subcutaneous emphysema about the operative site. No radiopaque foreign body. Limited visualization of the lower pelvis demonstrates mild degenerative change of the pubic symphysis. IMPRESSION: Post right total hip replacement without evidence of complication. Electronically Signed   By: Simonne Come M.D.   On: 01/23/2019 12:33   Dg Hip Operative Unilat With Pelvis Right  Result Date: 01/23/2019 CLINICAL  DATA:  Right femoral neck fracture. EXAM: DG C-ARM 1-60 MIN; OPERATIVE RIGHT HIP WITH PELVIS FLUOROSCOPY TIME:  35 seconds COMPARISON:  Right hip radiographs-01/22/2019 FINDINGS: 14 spot intraoperative fluoroscopic images of the right hip and lower pelvis are provided for review Provided images demonstrate the sequela of right total hip replacement. Alignment appears anatomic given AP projection. There is a minimal amount of expected subcutaneous emphysema about the operative site. No radiopaque foreign body. Limited visualization of the lower pelvis demonstrates mild degenerative change of the pubic symphysis. IMPRESSION: Post right total hip replacement without evidence of complication. Electronically Signed   By: Simonne Come M.D.   On: 01/23/2019 12:33   Dg Hip Unilat With Pelvis 2-3 Views Right  Result Date: 01/22/2019 CLINICAL DATA:  Fall from standing EXAM: DG HIP (WITH OR WITHOUT PELVIS) 2-3V RIGHT COMPARISON:  None. FINDINGS: There is a mildly displaced fracture of the right femoral neck with superior subluxation of the femoral shaft. The femoral head still articulates with the acetabulum. There also appears to be cortical disruption of the inferior left sacrum. Diffuse osteopenia seen. 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 IMPRESSION: Mildly displaced right femoral neck fracture with superior subluxation  of the femoral shaft. Question of a nondisplaced fracture of the left sacral ala. Electronically Signed   By: Prudencio Pair M.D.   On: 01/22/2019 19:11     Subjective: Complaining of chronic neck pain  Discharge Exam: Vitals:   01/26/19 0448 01/26/19 0851  BP: (!) 131/59 (!) 148/70  Pulse: 66 92  Resp: 14 18  Temp: 98.7 F (37.1 C) 98.7 F (37.1 C)  SpO2: 96% 98%   Vitals:   01/25/19 1702 01/25/19 2016 01/26/19 0448 01/26/19 0851  BP:  (!) 143/74 (!) 131/59 (!) 148/70  Pulse:  81 66 92  Resp:  18 14 18   Temp: 98.1 F (36.7 C) 98.3 F (36.8 C) 98.7 F (37.1 C) 98.7 F (37.1 C)   TempSrc: Oral Oral Oral Oral  SpO2:  99% 96% 98%  Weight:      Height:        General: Pt is alert, awake, not in acute distress Cardiovascular: RRR, S1/S2 +, no rubs, no gallops Respiratory: CTA bilaterally, no wheezing, no rhonchi Abdominal: Soft, NT, ND, bowel sounds + Extremities: no edema, no cyanosis   The results of significant diagnostics from this hospitalization (including imaging, microbiology, ancillary and laboratory) are listed below for reference.     Microbiology: Recent Results (from the past 240 hour(s))  SARS Coronavirus 2 by RT PCR (hospital order, performed in [facility] hospital lab) Nasopharyngeal Nasopharyngeal Swab     Status: None   Collection Time: 01/22/19  8:02 PM   Specimen: Nasopharyngeal Swab  Result Value Ref Range Status   SARS Coronavirus 2 NEGATIVE NEGATIVE Final    Comment: (NOTE) SARS-CoV-2 target nucleic acids are NOT DETECTED. The SARS-CoV-2 RNA is generally detectable in upper and lower respiratory specimens during the acute phase of infection. The lowest concentration of SARS-CoV-2 viral copies this assay can detect is 250 copies / mL. A negative result does not preclude SARS-CoV-2 infection and should not be used as the sole basis for treatment or other patient management decisions.  A negative result may occur with improper specimen collection / handling, submission of specimen other than nasopharyngeal swab, presence of viral mutation(s) within the areas targeted by this assay, and inadequate number of viral copies (<250 copies / mL). A negative result must be combined with clinical observations, patient history, and epidemiological information. Fact Sheet for Patients:   StrictlyIdeas.no Fact Sheet for Healthcare Providers: BankingDealers.co.za This test is not yet approved or cleared  by the [place] FDA and has been authorized for detection and/or diagnosis of SARS-CoV-2  by FDA under an Emergency Use Authorization (EUA).  This EUA will remain in effect (meaning this test can be used) for the duration of the COVID-19 declaration under Section 564(b)(1) of the Act, 21 U.S.C. section 360bbb-3(b)(1), unless the authorization is terminated or revoked sooner. Performed at [facility], [place] [address] [place], [place] [address]   Surgical pcr screen     Status: None   Collection Time: 01/22/19 10:04 PM   Specimen: Nasal Mucosa; Nasal Swab  Result Value Ref Range Status   MRSA, PCR NEGATIVE NEGATIVE Final   Staphylococcus aureus NEGATIVE NEGATIVE Final    Comment: (NOTE) The Xpert SA Assay (FDA approved for NASAL specimens in patients 68 years of age and older), is one component of a comprehensive surveillance program. It is not intended to diagnose infection nor to guide or monitor treatment. Performed at [facility], [place] [address] [place], [place] [address]  SARS CORONAVIRUS 2 (TAT 6-24 HRS) Nasopharyngeal Nasopharyngeal Swab     Status: None   Collection Time: 01/25/19  7:35 PM   Specimen: Nasopharyngeal Swab  Result Value Ref Range Status   SARS Coronavirus 2 NEGATIVE NEGATIVE Final    Comment: (NOTE) SARS-CoV-2 target nucleic acids are NOT DETECTED. The SARS-CoV-2 RNA is generally detectable in upper and lower respiratory specimens during the acute phase of infection. Negative results do not preclude SARS-CoV-2 infection, do not rule out co-infections with other pathogens, and should not be used as the sole basis for treatment or other patient management decisions. Negative results must be combined with clinical observations, patient history, and epidemiological information. The expected result is Negative. Fact Sheet for Patients: HairSlick.no Fact Sheet for Healthcare Providers: quierodirigir.com This test is not yet approved or cleared by the [place] FDA and  has  been authorized for detection and/or diagnosis of SARS-CoV-2 by FDA under an Emergency Use Authorization (EUA). This EUA will remain  in effect (meaning this test can be used) for the duration of the COVID-19 declaration under Section 56 4(b)(1) of the Act, 21 U.S.C. section 360bbb-3(b)(1), unless the authorization is terminated or revoked sooner. Performed at [facility], [address], [place], [place] [address]      Labs: BNP (last 3 results) No results for input(s): BNP in the last 8760 hours. Basic Metabolic Panel: Recent Labs  Lab 01/22/19 1745 01/23/19 0514 01/24/19 0549 01/25/19 0609 01/26/19 0104  NA 143 138 137 140 139  K 4.0 4.2 4.1 3.5 4.2  CL 108 104 104 108 106  CO2 GLUCOSE 125* 147* 127* 157* 136*  BUN 28* 22 21 25* 22  CREATININE 1.35* 1.27* 1.26* 1.35* 1.18*  CALCIUM 9.3 9.3 8.8* 8.6* 8.5*   Liver Function Tests: No results for input(s): AST, ALT, ALKPHOS, BILITOT, PROT, ALBUMIN in the last 168 hours. No results for input(s): LIPASE, AMYLASE in the last 168 hours. No results for input(s): AMMONIA in the last 168 hours. CBC: Recent Labs  Lab 01/22/19 1745 01/23/19 0514 01/24/19 0549 01/25/19 0609 01/26/19 0104  WBC 10.8* 9.6 9.9 8.8 8.0  NEUTROABS 6.9  --   --   --   --   HGB 11.4* 11.4* 10.0* 8.9* 8.6*  HCT 36.4 34.7* 31.1* 27.1* 26.2*  MCV 96.0 92.8 94.2 93.4 93.9  PLT 217 188 170 148* 157   Cardiac Enzymes: No results for input(s): CKTOTAL, CKMB, CKMBINDEX, TROPONINI in the last 168 hours. BNP: Invalid input(s): POCBNP CBG: No results for input(s): GLUCAP in the last 168 hours. D-Dimer No results for input(s): DDIMER in the last 72 hours. Hgb A1c No results for input(s): HGBA1C in the last 72 hours. Lipid Profile No results for input(s): CHOL, HDL, LDLCALC, TRIG, CHOLHDL, LDLDIRECT in the last 72 hours. Thyroid function studies No results for input(s): TSH, T4TOTAL, T3FREE, THYROIDAB in the last 72  hours.  Invalid input(s): FREET3 Anemia work up No results for input(s): VITAMINB12, FOLATE, FERRITIN, TIBC, IRON, RETICCTPCT in the last 72 hours. Urinalysis No results found for: COLORURINE, APPEARANCEUR, LABSPEC, PHURINE, GLUCOSEU, HGBUR, BILIRUBINUR, KETONESUR, PROTEINUR, UROBILINOGEN, NITRITE, LEUKOCYTESUR Sepsis Labs Invalid input(s): PROCALCITONIN,  WBC,  LACTICIDVEN Microbiology Recent Results (from the past 240 hour(s))  SARS Coronavirus 2 by RT PCR (hospital order, performed in [facility] hospital lab) Nasopharyngeal Nasopharyngeal Swab     Status: None   Collection Time: 01/22/19  8:02 PM   Specimen: Nasopharyngeal Swab  Result  Value Ref Range Status   SARS Coronavirus 2 NEGATIVE NEGATIVE Final    Comment: (NOTE) SARS-CoV-2 target nucleic acids are NOT DETECTED. The SARS-CoV-2 RNA is generally detectable in upper and lower respiratory specimens during the acute phase of infection. The lowest concentration of SARS-CoV-2 viral copies this assay can detect is 250 copies / mL. A negative result does not preclude SARS-CoV-2 infection and should not be used as the sole basis for treatment or other patient management decisions.  A negative result may occur with improper specimen collection / handling, submission of specimen other than nasopharyngeal swab, presence of viral mutation(s) within the areas targeted by this assay, and inadequate number of viral copies (<250 copies / mL). A negative result must be combined with clinical observations, patient history, and epidemiological information. Fact Sheet for Patients:   BoilerBrush.com.cy Fact Sheet for Healthcare Providers: https://pope.com/ This test is not yet approved or cleared  by the [place] FDA and has been authorized for detection and/or diagnosis of SARS-CoV-2 by FDA under an Emergency Use Authorization (EUA).  This EUA will remain in effect (meaning this test can  be used) for the duration of the COVID-19 declaration under Section 564(b)(1) of the Act, 21 U.S.C. section 360bbb-3(b)(1), unless the authorization is terminated or revoked sooner. Performed at [facility], [address] [place], [place] [address]   Surgical pcr screen     Status: None   Collection Time: 01/22/19 10:04 PM   Specimen: Nasal Mucosa; Nasal Swab  Result Value Ref Range Status   MRSA, PCR NEGATIVE NEGATIVE Final   Staphylococcus aureus NEGATIVE NEGATIVE Final    Comment: (NOTE) The Xpert SA Assay (FDA approved for NASAL specimens in patients 12 years of age and older), is one component of a comprehensive surveillance program. It is not intended to diagnose infection nor to guide or monitor treatment. Performed at [facility], [address], [place], [place] [address]   SARS CORONAVIRUS 2 (TAT 6-24 HRS) Nasopharyngeal Nasopharyngeal Swab     Status: None   Collection Time: 01/25/19  7:35 PM   Specimen: Nasopharyngeal Swab  Result Value Ref Range Status   SARS Coronavirus 2 NEGATIVE NEGATIVE Final    Comment: (NOTE) SARS-CoV-2 target nucleic acids are NOT DETECTED. The SARS-CoV-2 RNA is generally detectable in upper and lower respiratory specimens during the acute phase of infection. Negative results do not preclude SARS-CoV-2 infection, do not rule out co-infections with other pathogens, and should not be used as the sole basis for treatment or other patient management decisions. Negative results must be combined with clinical observations, patient history, and epidemiological information. The expected result is Negative. Fact Sheet for Patients: HairSlick.no Fact Sheet for Healthcare Providers: quierodirigir.com This test is not yet approved or cleared by the [place] FDA and  has been authorized for detection and/or diagnosis of SARS-CoV-2 by FDA under an Emergency Use Authorization  (EUA). This EUA will remain  in effect (meaning this test can be used) for the duration of the COVID-19 declaration under Section 56 4(b)(1) of the Act, 21 U.S.C. section 360bbb-3(b)(1), unless the authorization is terminated or revoked sooner. Performed at [facility], [address], [place], [place] [address]    Time spent: 30 min  SIGNED:   Rickey Barbara, MD  [facility] 01/26/2019, 10:59 AM  If 7PM-7AM, please contact night-coverage

## 2019-01-26 NOTE — Progress Notes (Signed)
14:09 - Attempted to call report to SNF. Operator transferred me to unit, but got no answer.  Merrie Roof taking over care for patient and will call report to SNF

## 2019-01-26 NOTE — Psychosocial Assessment (Signed)
Insurance Auth Details  Reference Number  [id number]  Auth ID #  [id number]  Start of Care Date 01/25/2019  Next review date 01/27/2019  Care Coordinator-Jessica Michiel Sites updated clinicals to [phone]

## 2019-01-26 NOTE — Progress Notes (Signed)
PT Cancellation Note  Patient Details Name: Danielle Ayala MRN: [id number] DOB: 1941-01-22   Cancelled Treatment:    Reason Eval/Treat Not Completed: Patient at procedure or test/unavailable   Lisanne Ponce B Yarenis Cerino 01/26/2019, 11:51 AM  Bayard Males, PT Acute Rehabilitation Services Pager: [phone] Office: [phone]

## 2019-01-26 NOTE — Progress Notes (Signed)
Called facility to give report, talked to [facility], Pt not in distress, discharged to facility with belongings via [place].

## 2019-01-27 ENCOUNTER — Non-Acute Institutional Stay (SKILLED_NURSING_FACILITY): Payer: Medicare Other | Admitting: Internal Medicine

## 2019-01-27 ENCOUNTER — Encounter: Payer: Self-pay | Admitting: Internal Medicine

## 2019-01-27 DIAGNOSIS — I10 Essential (primary) hypertension: Secondary | ICD-10-CM

## 2019-01-27 DIAGNOSIS — Z8739 Personal history of other diseases of the musculoskeletal system and connective tissue: Secondary | ICD-10-CM

## 2019-01-27 DIAGNOSIS — D649 Anemia, unspecified: Secondary | ICD-10-CM

## 2019-01-27 DIAGNOSIS — S72001D Fracture of unspecified part of neck of right femur, subsequent encounter for closed fracture with routine healing: Secondary | ICD-10-CM | POA: Diagnosis not present

## 2019-01-27 DIAGNOSIS — N179 Acute kidney failure, unspecified: Secondary | ICD-10-CM

## 2019-01-27 NOTE — Progress Notes (Signed)
This is an acute visit.  Level care skilled.  Facility is [facility].  Chief complaint acute visit status post hospitalization for right hip fracture with surgical repair.  History of present illness.  Patient is a pleasant 78 year old female seen today status post hospital admission for a right hip fracture that was surgically repaired.  She has a previous history of hypertension and a left-sided nephrectomy for tumor as well as chronic neck pain.  Apparently she fell at a [employer] and sustained a right hip fracture.  She is now status post total right hip replacement and has been cleared Lovenox for DVT prophylaxis she is here for PT and OT.  She is looking forward to doing therapy and getting home at this point.  He also has a history of hypertension her Cozaar apparently was held because of renal issues on admission but was restarted on a lower dose of 50 mg a day-blood pressure this morning is 140/74 at this point will monitor.  Regards to acute renal failure with single kidney creatinine remained stable ARB was restarted at a lower dose renal function apparently improved during her hospitalization creatinine was down to 1.18 on lab done on December.  She also has a history of chronic anemia appears lab done at the hospital yesterday showed a hemoglobin of 8.6 on December 7 it was 8.9 ---somewhat   down from previous level which had been more in the 10-11 range it appears-this will need to be rechecked.  In regards to chronic neck pain she continues on Lyrica and apparently had been on a Lidoderm patch at one point.  When she came yesterday apparently she had significant neck pain but says it is much better today -- she has chronic soreness in this regards and sometimes headaches  Currently she is sitting in her chair comfortably she is bright and alert says she still feels somewhat weak but not more so than when she was in the hospital she actually feels she is getting  a little stronger.  Vital signs appear to be stable.  Past Medical History:  Diagnosis Date  . Back pain   . Concussion 1967 & 1985  . Hypertension   . Neck pain          Past Surgical History:  Procedure Laterality Date  . BACK SURGERY    . NEPHRECTOMY Left   . TONSILLECTOMY    . UMBILICAL HERNIA REPAIR       reports that she has never smoked. She has never used smokeless tobacco. She reports that she does not drink alcohol or use drugs.       Allergies  Allergen Reactions  . Cleocin [Clindamycin Hcl] Rash  . Cholesterol     Cholesterol medications make her sick    Family History  Problem Relation Age of Onset  . Cancer Mother      Medication List        TAKE these medications       docusate sodium 100 MG capsule Commonly known as: COLACE Take 100 mg by mouth daily.   enoxaparin 40 MG/0.4ML injection Commonly known as: LOVENOX Inject 0.4 mLs (40 mg total) into the skin daily.   losartan 50 MG tablet Commonly known as: Cozaar Take 1 tablet (50 mg total) by mouth daily. What changed:   medication strength  how much to take   oxyCODONE-acetaminophen 5-325 MG tablet Commonly known as: Percocet Take 1-2 tablets by mouth every 8 (eight) hours as needed for  severe pain.   pregabalin 150 MG capsule Commonly known as: LYRICA Take 75 mg by mouth 2 (two) times daily.                --Review of systems.  General she is not complaining of any fever chills feels she is getting a little stronger.  Skin does not complain of rashes or itching surgical site right hip is currently covered.  Head ears eyes nose mouth and throat she has prescription lenses is not complaining of a sore throat or visual changes.  Respiratory denies shortness of breath or increased cough.  Cardiac does not complain of chest pain or increased edema.  GI not complaining of abdominal discomfort nausea vomiting diarrhea says she does have somewhat  chronic constipation.  GU is not complaining of dysuria.  Musculoskeletal does have history of chronic neck pain she says this is better today than it was yesterday-does not really complain of significant hip discomfort at this time.  Neurologic does not complain of dizziness numbness or syncope she says occasionally she gets headaches because of her neck pain but this is not new.  Psych does not complain of being depressed or anxious she is looking forward to getting home.  Marland Kitchen  Physical exam.  Temperature is 97.2 pulse 80 respirations 18 blood pressure 140/74.  In general this is a pleasant elderly female in no distress sitting comfortably in her chair.  Her skin is warm and dry surgical site right hip is currently covered there is some mild.  Bruising evident.  Eyes visual acuity appears to be intact sclera and conjunctive are clear she has prescription lenses.  Oropharynx is clear mucous membranes moist.  Chest is clear to auscultation there is no labored breathing.  Heart is regular rate and rhythm without murmur gallop or rub she does not really have significant lower extremity edema pedal pulses grossly intact.  Abdomen is somewhat obese soft nontender with positive bowel sounds.  Musculoskeletal Limited exam since she is sitting down but is able to move all extremities x4 with limitations of her right lower extremity secondary to surgery-capillary refill is intact right lower extremity   Neurologic is grossly intact touch sensation appears to be intact lower extremities her speech is clear.  Psych she is alert and oriented very pleasant and appropriate.  Labs.  January 26, 2019.  Sodium 139 potassium 4.2 BUN 22 creatinine 1.18.  WBC 8.0 hemoglobin 8.6 platelets 157.  December seventh 2020.  WBC 8.8 hemoglobin 8.9 platelets 148  January 24, 2019.  WBC 9.9 hemoglobin 10.0 platelets 170.   Assessment and plan.  1.  History of right hip fracture status post  repair she will need orthopedic follow-up as well as PT and OT.  Continues on Lovenox for DVT prophylaxis and has Percocet as needed for pain at this point she appears to be fairly comfortable.  2.  History of anemia most likely an element of postop on top of chronic anemia-hemoglobin is down somewhat at 8.6 most recent lab-we will have this rechecked tomorrow we will attempt to guaiac any stools for any signs of bleeding she denies any signs of bleeding.  Consider possibly iron but this is complicated somewhat with her constipation history.  3.  History of hypertension she continues on Cozaar 50 mg a day this was restarted at a lower dose secondary to renal issues creatinine did did show improvement-again will update a metabolic panel later this week for follow-up blood pressure today is 140/74 will  monitor.  4.  History of acute kidney injury again creatinine apparently did improve in the hospital.  Cozaar has been restarted at a lower dose at this point will monitor.  5.  History of chronic neck pain this is a long-term per patient she is on Lyrica 75 twice daily and she says this is giving her some relief x-rays were negative for an acute process in the hospital.  #6 chronic constipation she does have orders for Colace at this point will monitor  [id number]-of note greater than 35 minutes spent assessing patient-reviewing her chart and labs-discussing her status with nursing-and coordinating and formulating a plan of care-of note greater than 50% of time spent coordinating a plan of care with input as noted above

## 2019-01-28 LAB — BASIC METABOLIC PANEL
BUN: 24 — AB (ref 4–21)
CO2: 26 — AB (ref 13–22)
Chloride: 106 (ref 99–108)
Creatinine: 1.1 (ref 0.5–1.1)
Glucose: 110
Potassium: 4.2 (ref 3.4–5.3)
Sodium: 143 (ref 137–147)

## 2019-01-28 LAB — COMPREHENSIVE METABOLIC PANEL
Calcium: 9.2 (ref 8.7–10.7)
GFR calc Af Amer: 56.93
GFR calc non Af Amer: 49.12

## 2019-01-28 LAB — CBC: RBC: 3.18 — AB (ref 3.87–5.11)

## 2019-01-28 LAB — CBC AND DIFFERENTIAL
HCT: 29 — AB (ref 36–46)
Hemoglobin: 9.6 — AB (ref 12.0–16.0)
Neutrophils Absolute: 5
Platelets: 264 (ref 150–399)
WBC: 8.1

## 2019-01-29 ENCOUNTER — Non-Acute Institutional Stay (SKILLED_NURSING_FACILITY): Payer: Medicare Other | Admitting: Internal Medicine

## 2019-01-29 ENCOUNTER — Other Ambulatory Visit: Payer: Self-pay | Admitting: Internal Medicine

## 2019-01-29 ENCOUNTER — Encounter: Payer: Self-pay | Admitting: Internal Medicine

## 2019-01-29 DIAGNOSIS — Z96641 Presence of right artificial hip joint: Secondary | ICD-10-CM

## 2019-01-29 DIAGNOSIS — Z905 Acquired absence of kidney: Secondary | ICD-10-CM | POA: Diagnosis not present

## 2019-01-29 DIAGNOSIS — D649 Anemia, unspecified: Secondary | ICD-10-CM

## 2019-01-29 DIAGNOSIS — S72001D Fracture of unspecified part of neck of right femur, subsequent encounter for closed fracture with routine healing: Secondary | ICD-10-CM

## 2019-01-29 DIAGNOSIS — M542 Cervicalgia: Secondary | ICD-10-CM

## 2019-01-29 DIAGNOSIS — N179 Acute kidney failure, unspecified: Secondary | ICD-10-CM | POA: Diagnosis not present

## 2019-01-29 DIAGNOSIS — K5900 Constipation, unspecified: Secondary | ICD-10-CM

## 2019-01-29 DIAGNOSIS — G8929 Other chronic pain: Secondary | ICD-10-CM

## 2019-01-29 DIAGNOSIS — I10 Essential (primary) hypertension: Secondary | ICD-10-CM

## 2019-01-29 MED ORDER — OXYCODONE HCL 5 MG PO TABS: 5.0000 mg | ORAL_TABLET | Freq: Four times a day (QID) | ORAL | 0 refills | Status: DC | PRN

## 2019-01-29 MED ORDER — OXYCODONE-ACETAMINOPHEN 5-325 MG PO TABS: 1.0000 | ORAL_TABLET | Freq: Three times a day (TID) | ORAL | 0 refills | Status: DC | PRN

## 2019-01-29 NOTE — Progress Notes (Signed)
: Provider:  Margit Hanks., MD Location:  [facility] Room Number: 513-P Place of Service:  SNF ([phone])  PCP: Jolene Provost, MD Patient Care Team: Jolene Provost, MD as PCP - General (Family Medicine)  Extended Emergency Contact Information Primary Emergency Contact: Earlie Counts Mobile Phone: [phone] Relation: Daughter     Allergies: Cleocin [clindamycin hcl] and Cholesterol  Chief Complaint  Patient presents with  . New Admit To SNF    New admission to [facility] SNF    HPI: Patient is a 78 y.o. female with hypertension anemia and history of left-sided nephrectomy for tumor, and neck pain on Lyrica who was brought to the ED after a fall at [facility].  Patient thinks she may have tripped on something and fell.  She denied hitting her head or losing consciousness..  She denied any chest pain or shortness of breath.  But patient was having pain in her right hip area.  Patient is admitted to [facility] from 12/4-8 where she was found to have a fracture of the right  neck of the right femur.  Patient underwent a total right hip replacement.  Patient did have acute kidney injury on chronic renal disease which improved with IV fluids and with the holding of Cozaar.  Patient is admitted to skilled nursing facility for OT/PT while at skilled nursing facility patient will be followed for hypertension treated with losartan 50 mg daily, chronic neck pain treated with Lyrica and Lidoderm patch, and constipation treated with Colace.   Past Medical History:  Diagnosis Date  . Back pain   . Concussion 1967 & 1985  . Hypertension   . Neck pain     Past Surgical History:  Procedure Laterality Date  . BACK SURGERY    . NEPHRECTOMY Left   . TONSILLECTOMY    . TOTAL HIP ARTHROPLASTY Right 01/23/2019   Procedure: TOTAL HIP ARTHROPLASTY ANTERIOR APPROACH;  Surgeon: Tarry Kos, MD;  Location: [facility] OR;  Service: Orthopedics;  Laterality: Right;   . UMBILICAL HERNIA REPAIR      Allergies as of 01/29/2019      Reactions   Cleocin [clindamycin Hcl] Rash   Cholesterol    Cholesterol medications make her sick      Medication List       Accurate as of January 29, 2019  8:46 PM. If you have any questions, ask your nurse or doctor.        STOP taking these medications   oxyCODONE-acetaminophen 5-325 MG tablet Commonly known as: PERCOCET/ROXICET Stopped by: Merrilee Seashore, MD     TAKE these medications   docusate sodium 100 MG capsule Commonly known as: COLACE Take 100 mg by mouth daily.   enoxaparin 40 MG/0.4ML injection Commonly known as: LOVENOX Inject 0.4 mLs (40 mg total) into the skin daily.   losartan 50 MG tablet Commonly known as: Cozaar Take 1 tablet (50 mg total) by mouth daily.   oxyCODONE 5 MG immediate release tablet Commonly known as: Roxicodone Take 1 tablet (5 mg total) by mouth every 6 (six) hours as needed for severe pain. Started by: Merrilee Seashore, MD   pregabalin 150 MG capsule Commonly known as: LYRICA Take 75 mg by mouth 2 (two) times daily.       No orders of the defined types were placed in this encounter.   Immunization History  Administered Date(s) Administered  . Influenza, High Dose Seasonal PF 10/29/2018  . Influenza,trivalent, recombinat,  inj, PF 11/21/2015, 11/25/2016, 12/23/2017  . Influenza-Unspecified 10/24/2014  . Pneumococcal Conjugate-13 01/05/2014  . Pneumococcal Polysaccharide-23 07/03/2007  . Tdap 02/19/2007  . Zoster 07/01/2011    Social History   Tobacco Use  . Smoking status: Never Smoker  . Smokeless tobacco: Never Used  Substance Use Topics  . Alcohol use: Never    Family history is   Family History  Problem Relation Age of Onset  . Cancer Mother       Review of Systems  GENERAL:  no fevers, fatigue, appetite changes SKIN: No itching, or rash EYES: No eye pain, redness, discharge EARS: No earache, tinnitus, change in hearing NOSE: No  congestion, drainage or bleeding  MOUTH/THROAT: No mouth or tooth pain, No sore throat RESPIRATORY: No cough, wheezing, SOB CARDIAC: No chest pain, palpitations, lower extremity edema  GI: No abdominal pain, No N/V/D or constipation, No heartburn or reflux  GU: No dysuria, frequency or urgency, or incontinence  MUSCULOSKELETAL: No unrelieved bone/joint pain NEUROLOGIC: No headache, dizziness or focal weakness PSYCHIATRIC: No c/o anxiety or sadness   Vitals:   01/29/19 1031  BP: 118/66  Pulse: 70  Resp: 18  Temp: (!) 97.5 F (36.4 C)    SpO2 Readings from Last 1 Encounters:  01/26/19 98%   Body mass index is 31.69 kg/m.     Physical Exam  GENERAL APPEARANCE: Alert, conversant,  No acute distress.  SKIN: Postop dressing in place without excessive heat or swelling HEAD: Normocephalic, atraumatic  EYES: Conjunctiva/lids clear. Pupils round, reactive. EOMs intact.  EARS: External exam WNL, canals clear. Hearing grossly normal.  NOSE: No deformity or discharge.  MOUTH/THROAT: Lips w/o lesions  RESPIRATORY: Breathing is even, unlabored. Lung sounds are clear   CARDIOVASCULAR: Heart RRR no murmurs, rubs or gallops.  Trace peripheral edema on right lower extremity GASTROINTESTINAL: Abdomen is soft, non-tender, not distended w/ normal bowel sounds. GENITOURINARY: Bladder non tender, not distended  MUSCULOSKELETAL: No abnormal joints or musculature NEUROLOGIC:  Cranial nerves 2-12 grossly intact. Moves all extremities  PSYCHIATRIC: Mood and affect appropriate to situation, no behavioral issues  Patient Active Problem List   Diagnosis Date Noted  . Closed fracture of neck of right femur ([place]) 01/22/2019  . Essential hypertension 01/22/2019  . Normochromic normocytic anemia 01/22/2019  . Closed right hip fracture, initial encounter ([place]) 01/22/2019      Labs reviewed: Basic Metabolic Panel:    Component Value Date/Time   NA 139 01/26/2019 0104   K 4.2 01/26/2019 0104    CL 106 01/26/2019 0104   CO2 26 01/26/2019 0104   GLUCOSE 136 (H) 01/26/2019 0104   BUN 22 01/26/2019 0104   CREATININE 1.18 (H) 01/26/2019 0104   CALCIUM 8.5 (L) 01/26/2019 0104   GFRNONAA 45 (L) 01/26/2019 0104   GFRAA 52 (L) 01/26/2019 0104    Recent Labs    01/24/19 0549 01/25/19 0609 01/26/19 0104  NA 137 140 139  K 4.1 3.5 4.2  CL 104 108 106  CO2 25 26 26   GLUCOSE 127* 157* 136*  BUN 21 25* 22  CREATININE 1.26* 1.35* 1.18*  CALCIUM 8.8* 8.6* 8.5*   Liver Function Tests: No results for input(s): AST, ALT, ALKPHOS, BILITOT, PROT, ALBUMIN in the last 8760 hours. No results for input(s): LIPASE, AMYLASE in the last 8760 hours. No results for input(s): AMMONIA in the last 8760 hours. CBC: Recent Labs    01/22/19 1745 01/24/19 0549 01/25/19 0609 01/26/19 0104  WBC 10.8* 9.9 8.8 8.0  NEUTROABS 6.9  --   --   --  HGB 11.4* 10.0* 8.9* 8.6*  HCT 36.4 31.1* 27.1* 26.2*  MCV 96.0 94.2 93.4 93.9  PLT 217 170 148* 157   Lipid No results for input(s): CHOL, HDL, LDLCALC, TRIG in the last 8760 hours.  Cardiac Enzymes: No results for input(s): CKTOTAL, CKMB, CKMBINDEX, TROPONINI in the last 8760 hours. BNP: No results for input(s): BNP in the last 8760 hours. No results found for: MICROALBUR No results found for: HGBA1C No results found for: TSH No results found for: VITAMINB12 No results found for: FOLATE No results found for: IRON, TIBC, FERRITIN  Imaging and Procedures obtained prior to SNF admission: DG Chest 1 View  Result Date: 01/22/2019 CLINICAL DATA:  Preop EXAM: CHEST  1 VIEW COMPARISON:  None. FINDINGS: The heart size and mediastinal contours are within normal limits. Both lungs are clear. The visualized skeletal structures are unremarkable. IMPRESSION: No active disease. Electronically Signed   By: Jonna Clark M.D.   On: 01/22/2019 19:11   Pelvis Portable  Result Date: 01/23/2019 CLINICAL DATA:  Right femoral neck fracture, post right hip replacement  EXAM: PORTABLE PELVIS 1-2 VIEWS COMPARISON:  Intraoperative radiographs-earlier same day; right hip radiographs-01/22/2019 FINDINGS: Post right total hip replacement. Alignment appears anatomic given AP projection. No additional fracture identified. Suspected mild-to-moderate degenerative change of the contralateral left hip. Degenerative change of pubic symphysis. Subcutaneous emphysema about the operative site. No radiopaque foreign body. IMPRESSION: Post right total hip replacement without evidence of complication. Electronically Signed   By: Simonne Come M.D.   On: 01/23/2019 12:35   DG C-Arm 1-60 Min  Result Date: 01/23/2019 CLINICAL DATA:  Right femoral neck fracture. EXAM: DG C-ARM 1-60 MIN; OPERATIVE RIGHT HIP WITH PELVIS FLUOROSCOPY TIME:  35 seconds COMPARISON:  Right hip radiographs-01/22/2019 FINDINGS: 14 spot intraoperative fluoroscopic images of the right hip and lower pelvis are provided for review Provided images demonstrate the sequela of right total hip replacement. Alignment appears anatomic given AP projection. There is a minimal amount of expected subcutaneous emphysema about the operative site. No radiopaque foreign body. Limited visualization of the lower pelvis demonstrates mild degenerative change of the pubic symphysis. IMPRESSION: Post right total hip replacement without evidence of complication. Electronically Signed   By: Simonne Come M.D.   On: 01/23/2019 12:33   DG HIP OPERATIVE UNILAT WITH PELVIS RIGHT  Result Date: 01/23/2019 CLINICAL DATA:  Right femoral neck fracture. EXAM: DG C-ARM 1-60 MIN; OPERATIVE RIGHT HIP WITH PELVIS FLUOROSCOPY TIME:  35 seconds COMPARISON:  Right hip radiographs-01/22/2019 FINDINGS: 14 spot intraoperative fluoroscopic images of the right hip and lower pelvis are provided for review Provided images demonstrate the sequela of right total hip replacement. Alignment appears anatomic given AP projection. There is a minimal amount of expected subcutaneous  emphysema about the operative site. No radiopaque foreign body. Limited visualization of the lower pelvis demonstrates mild degenerative change of the pubic symphysis. IMPRESSION: Post right total hip replacement without evidence of complication. Electronically Signed   By: Simonne Come M.D.   On: 01/23/2019 12:33   DG Hip Unilat With Pelvis 2-3 Views Right  Result Date: 01/22/2019 CLINICAL DATA:  Fall from standing EXAM: DG HIP (WITH OR WITHOUT PELVIS) 2-3V RIGHT COMPARISON:  None. FINDINGS: There is a mildly displaced fracture of the right femoral neck with superior subluxation of the femoral shaft. The femoral head still articulates with the acetabulum. There also appears to be cortical disruption of the inferior left sacrum. Diffuse osteopenia seen. 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 IMPRESSION:  Mildly displaced right femoral neck fracture with superior subluxation of the femoral shaft. Question of a nondisplaced fracture of the left sacral ala. Electronically Signed   By: Jonna ClarkBindu  Avutu M.D.   On: 01/22/2019 19:11     Not all labs, radiology exams or other studies done during hospitalization come through on my EPIC note; however they are reviewed by me.    Assessment and Plan  Fracture surgical neck/mechanical fall-status post right hip replacement; prophylaxis with Lovenox SNF-admitted for OT/PT; prophylaxis with Lovenox40 mg daily presumed for 21 days as is not stated in the discharge summary  Acute renal failure with one kidney-losartan was held during hospitalization; improved with IV fluids SNF-follow-up BMP  Hypertension SNF-stable; continue losartan 50 mg daily  History neck pain SNF-chronic continue Lyrica 75 mg twice daily and restart Lidoderm patch daily as well as Tylenol CR 650 mg every 6  Constipation SNF-appears controlled; continue Colace 100 mg daily  Normochromic normocytic anemia SNF-follow-up CBC     Time spent 45 minutes;> 50% of time with patient was  spent reviewing records, labs, tests and studies, counseling and developing plan of care   Margit HanksAnne D Leopold Smyers, MD

## 2019-02-01 ENCOUNTER — Other Ambulatory Visit: Payer: Self-pay | Admitting: Internal Medicine

## 2019-02-01 LAB — CBC: RBC: 3.18 — AB (ref 3.87–5.11)

## 2019-02-01 LAB — CBC AND DIFFERENTIAL
HCT: 29 — AB (ref 36–46)
Hemoglobin: 9.7 — AB (ref 12.0–16.0)
Neutrophils Absolute: 5
Platelets: 373 (ref 150–399)
WBC: 7.6

## 2019-02-01 LAB — BASIC METABOLIC PANEL
BUN: 25 — AB (ref 4–21)
CO2: 27 — AB (ref 13–22)
Chloride: 105 (ref 99–108)
Creatinine: 1.3 — AB (ref 0.5–1.1)
Glucose: 105
Potassium: 4.7 (ref 3.4–5.3)
Sodium: 141 (ref 137–147)

## 2019-02-01 LAB — COMPREHENSIVE METABOLIC PANEL: Calcium: 9.6 (ref 8.7–10.7)

## 2019-02-02 ENCOUNTER — Encounter: Payer: Self-pay | Admitting: Internal Medicine

## 2019-02-03 DIAGNOSIS — Z905 Acquired absence of kidney: Secondary | ICD-10-CM | POA: Insufficient documentation

## 2019-02-03 DIAGNOSIS — G8929 Other chronic pain: Secondary | ICD-10-CM | POA: Insufficient documentation

## 2019-02-03 DIAGNOSIS — Z96641 Presence of right artificial hip joint: Secondary | ICD-10-CM | POA: Insufficient documentation

## 2019-02-03 DIAGNOSIS — N179 Acute kidney failure, unspecified: Secondary | ICD-10-CM | POA: Insufficient documentation

## 2019-02-03 DIAGNOSIS — K5900 Constipation, unspecified: Secondary | ICD-10-CM | POA: Insufficient documentation

## 2019-02-05 ENCOUNTER — Ambulatory Visit (INDEPENDENT_AMBULATORY_CARE_PROVIDER_SITE_OTHER): Payer: Medicare Other | Admitting: Orthopaedic Surgery

## 2019-02-05 ENCOUNTER — Ambulatory Visit (INDEPENDENT_AMBULATORY_CARE_PROVIDER_SITE_OTHER): Payer: Medicare Other

## 2019-02-05 ENCOUNTER — Encounter: Payer: Self-pay | Admitting: Internal Medicine

## 2019-02-05 ENCOUNTER — Telehealth: Payer: Self-pay | Admitting: Radiology

## 2019-02-05 ENCOUNTER — Non-Acute Institutional Stay (SKILLED_NURSING_FACILITY): Payer: Medicare Other | Admitting: Internal Medicine

## 2019-02-05 ENCOUNTER — Other Ambulatory Visit: Payer: Self-pay

## 2019-02-05 DIAGNOSIS — G8929 Other chronic pain: Secondary | ICD-10-CM

## 2019-02-05 DIAGNOSIS — D649 Anemia, unspecified: Secondary | ICD-10-CM

## 2019-02-05 DIAGNOSIS — I10 Essential (primary) hypertension: Secondary | ICD-10-CM

## 2019-02-05 DIAGNOSIS — M542 Cervicalgia: Secondary | ICD-10-CM

## 2019-02-05 DIAGNOSIS — N179 Acute kidney failure, unspecified: Secondary | ICD-10-CM | POA: Diagnosis not present

## 2019-02-05 DIAGNOSIS — S72001D Fracture of unspecified part of neck of right femur, subsequent encounter for closed fracture with routine healing: Secondary | ICD-10-CM

## 2019-02-05 DIAGNOSIS — S72001A Fracture of unspecified part of neck of right femur, initial encounter for closed fracture: Secondary | ICD-10-CM

## 2019-02-05 MED ORDER — LIDOCAINE 5 % EX PTCH: 1.0000 | MEDICATED_PATCH | CUTANEOUS | 0 refills | Status: AC

## 2019-02-05 MED ORDER — OXYCODONE HCL 5 MG PO TABS: 5.0000 mg | ORAL_TABLET | Freq: Four times a day (QID) | ORAL | 0 refills | Status: AC | PRN

## 2019-02-05 MED ORDER — PREGABALIN 75 MG PO CAPS: 75.0000 mg | ORAL_CAPSULE | Freq: Two times a day (BID) | ORAL | 0 refills | Status: AC

## 2019-02-05 MED ORDER — LOSARTAN POTASSIUM 50 MG PO TABS: 50.0000 mg | ORAL_TABLET | Freq: Every day | ORAL | 0 refills | Status: AC

## 2019-02-05 NOTE — Telephone Encounter (Signed)
No she does not need lovenox.  She does need aspirin 81 mg bid x 4 weeks

## 2019-02-05 NOTE — Telephone Encounter (Signed)
Advised and gave verbal to [facility]

## 2019-02-05 NOTE — Progress Notes (Signed)
Location:  [facility] Room Number: 513-P Place of Service:  SNF [phone])  Provider: Edmon Crape, PA-C  PCP: Jolene Provost, MD Patient Care Team: Jolene Provost, MD as PCP - General (Family Medicine)  Extended Emergency Contact Information Primary Emergency Contact: Earlie Counts Mobile Phone: [phone] Relation: Daughter  Code Status: Full Code Goals of care:  Advanced Directive information Advanced Directives 01/23/2019  Does Patient Have a Medical Advance Directive? Yes  Type of Advance Directive Living will  Does patient want to make changes to medical advance directive? No - Patient declined  Would patient like information on creating a medical advance directive? -     Allergies  Allergen Reactions  . Cleocin [Clindamycin Hcl] Rash  . Cholesterol     Cholesterol medications make her sick    Chief Complaint  Patient presents with  . Discharge Note    Patient seen for discharge on 02/06/19    HPI:  78 y.o. female seen today for discharge from facility tomorrow December 19  She has been here for rehab after sustaining a right hip fracture after a fall at a local [employer].  This was surgically repaired and she appears to be doing well she actually saw orthopedics this morning and thought to be doing all right they have scheduled a follow-up in about 4 weeks.  She has progressed with therapy and she is looking forward to going home.  She has been on Lovenox-we have contacted orthopedics and they say it is okay to discontinue the Lovenox and put her on aspirin 81 mg twice daily for 4 weeks.  She also apparently had acute kidney injury in the hospital this was replenished with fluids and her Cozaar was decreased-last creatinine was 1.26 BUN was 25.1-I have contacted her primary care provider and they will follow up with labs.  In regards to hypertension blood pressure peers to be somewhat variable today it was 124/68  appear systolics have ranged from the 90s to the 140s.  Clinically she appears stable on the Cozaar 50 mg a day.  She does have a history of chronic pain especially of her neck she is currently on oxycodone 5 mg every 6 hours that is scheduled every 6 hours as needed-we will give her a limited supply with follow-up by primary care provider-she also has a Lidoderm patch with order for Tylenol 3 times a day.  She also is on Lyrica 75 mg twice daily.  Currently she appears to be in good spirits again is looking forward to going home she has no acute complaints      Past Medical History:  Diagnosis Date  . Back pain   . Concussion 1967 & 1985  . Hypertension   . Neck pain     Past Surgical History:  Procedure Laterality Date  . BACK SURGERY    . NEPHRECTOMY Left   . TONSILLECTOMY    . TOTAL HIP ARTHROPLASTY Right 01/23/2019   Procedure: TOTAL HIP ARTHROPLASTY ANTERIOR APPROACH;  Surgeon: Tarry Kos, MD;  Location: [facility] OR;  Service: Orthopedics;  Laterality: Right;  . UMBILICAL HERNIA REPAIR        reports that she has never smoked. She has never used smokeless tobacco. She reports that she does not drink alcohol or use drugs. Social History   Socioeconomic History  . Marital status: Married    Spouse name: Not on file  . Number of children: Not on file  . Years of  education: Not on file  . Highest education level: Not on file  Occupational History  . Not on file  Tobacco Use  . Smoking status: Never Smoker  . Smokeless tobacco: Never Used  Substance and Sexual Activity  . Alcohol use: Never  . Drug use: Never  . Sexual activity: Not on file  Other Topics Concern  . Not on file  Social History Narrative   Married.  Never smoker.    Social Determinants of Health   Financial Resource Strain:   . Difficulty of Paying Living Expenses: Not on file  Food Insecurity:   . Worried About Programme researcher, broadcasting/film/videounning Out of Food in the Last Year: Not on file  . Ran Out of Food in the Last  Year: Not on file  Transportation Needs:   . Lack of Transportation (Medical): Not on file  . Lack of Transportation (Non-Medical): Not on file  Physical Activity:   . Days of Exercise per Week: Not on file  . Minutes of Exercise per Session: Not on file  Stress:   . Feeling of Stress : Not on file  Social Connections:   . Frequency of Communication with Friends and Family: Not on file  . Frequency of Social Gatherings with Friends and Family: Not on file  . Attends Religious Services: Not on file  . Active Member of Clubs or Organizations: Not on file  . Attends BankerClub or Organization Meetings: Not on file  . Marital Status: Not on file  Intimate Partner Violence:   . Fear of Current or Ex-Partner: Not on file  . Emotionally Abused: Not on file  . Physically Abused: Not on file  . Sexually Abused: Not on file   Functional Status Survey:    Allergies  Allergen Reactions  . Cleocin [Clindamycin Hcl] Rash  . Cholesterol     Cholesterol medications make her sick    Pertinent  Health Maintenance Due  Topic Date Due  . DEXA SCAN  03/08/2007  . INFLUENZA VACCINE  Completed  . PNA vac Low Risk Adult  Completed    Medications: Outpatient Encounter Medications as of 02/05/2019  Medication Sig  . docusate sodium (COLACE) 100 MG capsule Take 100 mg by mouth daily.  Marland Kitchen. enoxaparin (LOVENOX) 40 MG/0.4ML injection Inject 0.4 mLs (40 mg total) into the skin daily.  Marland Kitchen. lidocaine (LIDODERM) 5 % Place 1 patch onto the skin daily. Remove & Discard patch within 12 hours or as directed by MD.  . losartan (COZAAR) 50 MG tablet Take 1 tablet (50 mg total) by mouth daily.  Marland Kitchen. oxyCODONE (ROXICODONE) 5 MG immediate release tablet Take 1 tablet (5 mg total) by mouth every 6 (six) hours as needed for severe pain.  Marland Kitchen. oxyCODONE-acetaminophen (PERCOCET/ROXICET) 5-325 MG tablet Take 1 tablet by mouth every 6 (six) hours as needed for severe pain.  . pregabalin (LYRICA) 150 MG capsule Take 75 mg by mouth 2  (two) times daily.    No facility-administered encounter medications on file as of 02/05/2019.    Review of Systems   In general she is not complaining of any fever or chills.  Skin does not complain of rashes or itching.  Surgical site is currently covered this has been evaluated by orthopedics earlier today and thought to be stable.  Head ears eyes nose mouth and throat is not complain of visual changes or sore throat.  Respiratory does not complain of being short of breath or having a cough.  Cardiac does not complain of chest  pain or increased edema.  GI does not complain of abdominal discomfort nausea vomiting diarrhea constipation.  GU no complaints of dysuria.  Musculoskeletal does have a history of chronic neck pain-also recent hip surgery-at this point pain appears to be controlled-she says that the oxycodone on is effective apparently asked for this frequently.  Neurologic does not complain of dizziness headache numbness or syncope.  And psych does not complain of being depressed or anxious.    Vitals:   02/05/19 1043  BP: 124/68  Pulse: 71  Resp: 18  Temp: 98.6 F (37 C)  TempSrc: Oral  Weight: 184 lb 9.6 oz (83.7 kg)  Height: 5\' 4"  (1.626 m)   Body mass index is 31.69 kg/m. Physical Exam   In general this is a pleasant elderly female in no distress sitting comfortably in her chair.  Her skin is warm and dry surgical site was not assessed secondary to patient positioning-this was evaluated by orthopedics earlier today and felt to be stable.  Eyes visual acuity appears to be intact sclera and conjunctive are clear.  Oropharynx is clear mucous membranes moist.  Chest is clear to auscultation there is no labored breathing.  Heart is regular rate and rhythm without murmur gallop or rub she does not really have significant lower extremity edema.  Abdomen is soft nontender with positive bowel sounds.  Musculoskeletal is able to move all extremities  x4-she appears to be doing well with the right hip fracture-she is ambulating with walker.  Neurologic appears grossly intact her speech is clear could not appreciate lateralizing findings.  Psych she is alert and oriented pleasant and appropriate.    Labs reviewed:  February 01, 2019.  WBC 7.6 hemoglobin 9.7 platelets 373.  Sodium 141 potassium 4.7 BUN 25.1 creatinine 1.26.   Basic Metabolic Panel: Recent Labs    01/24/19 0549 01/25/19 0609 01/26/19 0104 01/28/19 0000  NA 137 140 139 143  K 4.1 3.5 4.2 4.2  CL 104 108 106 106  CO2 25 26 26  26*  GLUCOSE 127* 157* 136*  --   BUN 21 25* 22 24*  CREATININE 1.26* 1.35* 1.18* 1.1  CALCIUM 8.8* 8.6* 8.5* 9.2   Liver Function Tests: No results for input(s): AST, ALT, ALKPHOS, BILITOT, PROT, ALBUMIN in the last 8760 hours. No results for input(s): LIPASE, AMYLASE in the last 8760 hours. No results for input(s): AMMONIA in the last 8760 hours. CBC: Recent Labs    01/22/19 1745 01/24/19 0549 01/25/19 0609 01/26/19 0104 01/28/19 0000  WBC 10.8* 9.9 8.8 8.0 8.1  NEUTROABS 6.9  --   --   --  5  HGB 11.4* 10.0* 8.9* 8.6* 9.6*  HCT 36.4 31.1* 27.1* 26.2* 29*  MCV 96.0 94.2 93.4 93.9  --   PLT 217 170 148* 157 264   Cardiac Enzymes: No results for input(s): CKTOTAL, CKMB, CKMBINDEX, TROPONINI in the last 8760 hours. BNP: Invalid input(s): POCBNP CBG: No results for input(s): GLUCAP in the last 8760 hours.  Procedures and Imaging Studies During Stay: DG Chest 1 View  Result Date: 01/22/2019 CLINICAL DATA:  Preop EXAM: CHEST  1 VIEW COMPARISON:  None. FINDINGS: The heart size and mediastinal contours are within normal limits. Both lungs are clear. The visualized skeletal structures are unremarkable. IMPRESSION: No active disease. Electronically Signed   By: 14/10/20 M.D.   On: 01/22/2019 19:11   DG Cervical Spine 2 or 3 views  Result Date: 01/26/2019 CLINICAL DATA:  Had fall (Dec 4-per pt) with  resultant hip  fracture, with worse neck pain. Rule out cervical fracture. Left sided neck pain x Dec 5 Tech wore surgical mask/gloves, pt wore mask EXAM: CERVICAL SPINE - 2-3 VIEW COMPARISON:  05/08/2018 FINDINGS: There is exaggerated lordosis of cervical spine. Normal alignment. Degenerative changes are present, vertically in the LOWER cervical levels. There is limited visualization of C7-T1. Oblique images are not performed. No acute fracture or evidence for subluxation. Prevertebral soft tissues are unremarkable. Lung apices are clear. IMPRESSION: 1. Degenerative changes. 2. No evidence for acute abnormality. 3. If there is strong clinical concern for fracture, recommend oblique images and/or cervical spine CT. Electronically Signed   By: Nolon Nations M.D.   On: 01/26/2019 11:59   Pelvis Portable  Result Date: 01/23/2019 CLINICAL DATA:  Right femoral neck fracture, post right hip replacement EXAM: PORTABLE PELVIS 1-2 VIEWS COMPARISON:  Intraoperative radiographs-earlier same day; right hip radiographs-01/22/2019 FINDINGS: Post right total hip replacement. Alignment appears anatomic given AP projection. No additional fracture identified. Suspected mild-to-moderate degenerative change of the contralateral left hip. Degenerative change of pubic symphysis. Subcutaneous emphysema about the operative site. No radiopaque foreign body. IMPRESSION: Post right total hip replacement without evidence of complication. Electronically Signed   By: Sandi Mariscal M.D.   On: 01/23/2019 12:35   DG C-Arm 1-60 Min  Result Date: 01/23/2019 CLINICAL DATA:  Right femoral neck fracture. EXAM: DG C-ARM 1-60 MIN; OPERATIVE RIGHT HIP WITH PELVIS FLUOROSCOPY TIME:  35 seconds COMPARISON:  Right hip radiographs-01/22/2019 FINDINGS: 14 spot intraoperative fluoroscopic images of the right hip and lower pelvis are provided for review Provided images demonstrate the sequela of right total hip replacement. Alignment appears anatomic given AP  projection. There is a minimal amount of expected subcutaneous emphysema about the operative site. No radiopaque foreign body. Limited visualization of the lower pelvis demonstrates mild degenerative change of the pubic symphysis. IMPRESSION: Post right total hip replacement without evidence of complication. Electronically Signed   By: Sandi Mariscal M.D.   On: 01/23/2019 12:33   DG HIP OPERATIVE UNILAT WITH PELVIS RIGHT  Result Date: 01/23/2019 CLINICAL DATA:  Right femoral neck fracture. EXAM: DG C-ARM 1-60 MIN; OPERATIVE RIGHT HIP WITH PELVIS FLUOROSCOPY TIME:  35 seconds COMPARISON:  Right hip radiographs-01/22/2019 FINDINGS: 14 spot intraoperative fluoroscopic images of the right hip and lower pelvis are provided for review Provided images demonstrate the sequela of right total hip replacement. Alignment appears anatomic given AP projection. There is a minimal amount of expected subcutaneous emphysema about the operative site. No radiopaque foreign body. Limited visualization of the lower pelvis demonstrates mild degenerative change of the pubic symphysis. IMPRESSION: Post right total hip replacement without evidence of complication. Electronically Signed   By: Sandi Mariscal M.D.   On: 01/23/2019 12:33   DG Hip Unilat With Pelvis 2-3 Views Right  Result Date: 01/22/2019 CLINICAL DATA:  Fall from standing EXAM: DG HIP (WITH OR WITHOUT PELVIS) 2-3V RIGHT COMPARISON:  None. FINDINGS: There is a mildly displaced fracture of the right femoral neck with superior subluxation of the femoral shaft. The femoral head still articulates with the acetabulum. There also appears to be cortical disruption of the inferior left sacrum. Diffuse osteopenia seen. 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 IMPRESSION: Mildly displaced right femoral neck fracture with superior subluxation of the femoral shaft. Question of a nondisplaced fracture of the left sacral ala. Electronically Signed   By: Prudencio Pair M.D.   On: 01/22/2019  19:11    Assessment/Plan:   #1  history of right hip fracture after mechanical fall she is status post right hip replacement-as noted above will DC Lovenox and start her on aspirin 81 mg twice daily for 4 weeks per orthopedic recommendation.  For pain she continues on oxycodone 5 mg every 6 hours as needed we will give her a limited supply here she also has orders for Tylenol extended release 650 mg 3 times daily and Lyrica 75 mg twice daily.  She will have follow-up with orthopedics in 4 weeks with recheck of x-rays.  And will need continued PT and OT.  2.  History of acute renal failure with a history of nephrectomy-losartan was held and then restarted on a lower dose-blood pressure appears to be stable-creatinine will need to be monitored and I have spoken with primary care provider's office and they will be following up on this-.  3.  History of chronic neck pain again she is on numerous agents including Lyrica Lidoderm patch Tylenol and does have oxycodone-.  4.-History of hypertension-this appears to be relatively stable with some variable systolics she is on Cozaar 50 mg a day I do not see consistent elevations despite being on the lower dose of Cozaar.  5.  History of constipation she continues on Colace apparently this has not been a significant issue during her stay here.  6.  History of anemia this appears stable with a hemoglobin of 9.7 on lab done on 14 December this will warrant follow-up by primary care provider.  [id number] note greater than 30 minutes spent on this discharge summary-greater than 50% of time spent coordinating a plan of care for numerous diagnoses   Patient is being discharged with the following home health services: PT and OT  Patient is being discharged with the following durable medical equipment: None   Patient has been advised to f/u with their PCP in 1-2 weeks to for a transitions of care visit.  Social services at their facility was responsible  for arranging this appointment.  Pt was provided with adequate prescriptions of noncontrolled medications to reach the scheduled appointment .  For controlled substances, a limited supply was provided as appropriate for the individual patient.  If the pt normally receives these medications from a pain clinic or has a contract with another physician, these medications should be received from that clinic or physician only).

## 2019-02-05 NOTE — Telephone Encounter (Signed)
Granville Lewis, PA called please advise. Does patient need to continue Lovenox?  Patient is currently at facility, was seen in office this morning, ambulating well and scheduled to go home tomorrow.   CB#  [phone]

## 2019-02-05 NOTE — Progress Notes (Signed)
   Post-Op Visit Note   Patient: Danielle Ayala           Date of Birth: [birth year]           MRN: [id number] Visit Date: 02/05/2019 PCP: Karlene Einstein, MD   Assessment & Plan:  Chief Complaint:  Chief Complaint  Patient presents with  . Right Hip - Routine Post Op    01/23/2019 total hip arthroplasty   Visit Diagnoses:  1. Closed right hip fracture, initial encounter [facility])     Plan: Patient is a pleasant 78 year old female who presents our clinic today 2 weeks status post right anterior total hip replacement from a femoral neck fracture, date of surgery 01/23/2019.  She has been doing well.  She is ambulating with a walker.  She is living at [facility].  Examination of her right hip reveals a well-healing surgical incision with nylon sutures in place.  No evidence of infection or cellulitis.  Calf is soft and nontender.  At this point, she will continue with physical therapy.  She will follow-up with [place] in 4 weeks time for repeat evaluation and 2 view x-rays of the right hip.  Call with concerns or questions in the meantime.  Follow-Up Instructions: Return in about 4 weeks (around 03/05/2019).   Orders:  Orders Placed This Encounter  Procedures  . XR HIP UNILAT W OR W/O PELVIS 2-3 VIEWS RIGHT   No orders of the defined types were placed in this encounter.   Imaging: XR HIP UNILAT W OR W/O PELVIS 2-3 VIEWS RIGHT  Result Date: 02/05/2019 X-rays demonstrate a well-seated prosthesis without complication.   PMFS History: Patient Active Problem List   Diagnosis Date Noted  . Status post right hip replacement 02/03/2019  . Acute kidney injury ([place]) 02/03/2019  . 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 Neck pain, chronic 02/03/2019  . Constipation 02/03/2019  . Closed fracture of neck of right femur ([place]) 01/22/2019  . Essential hypertension 01/22/2019  . Normochromic normocytic anemia 01/22/2019  . Closed right hip fracture, initial encounter ([place]) 01/22/2019   Past  Medical History:  Diagnosis Date  . Back pain   . [place]  . Hypertension   . Neck pain     Family History  Problem Relation Age of Onset  . Cancer Mother     Past Surgical History:  Procedure Laterality Date  . BACK SURGERY    . NEPHRECTOMY Left   . TONSILLECTOMY    . TOTAL HIP ARTHROPLASTY Right 01/23/2019   Procedure: TOTAL HIP ARTHROPLASTY ANTERIOR APPROACH;  Surgeon: Leandrew Koyanagi, MD;  Location: [place];  Service: Orthopedics;  Laterality: Right;  . UMBILICAL HERNIA REPAIR     Social History   Occupational History  . Not on file  Tobacco Use  . Smoking status: Never Smoker  . Smokeless tobacco: Never Used  Substance and Sexual Activity  . Alcohol use: Never  . Drug use: Never  . Sexual activity: Not on file

## 2019-02-15 ENCOUNTER — Telehealth: Payer: Self-pay | Admitting: Orthopaedic Surgery

## 2019-02-15 NOTE — Telephone Encounter (Signed)
FYI

## 2019-02-15 NOTE — Telephone Encounter (Signed)
Danielle Ayala from [facility] called. Mrs. Danielle Ayala had a home health ot eval on 12/24 and declined any additional visits

## 2019-02-16 ENCOUNTER — Telehealth: Payer: Self-pay

## 2019-02-16 NOTE — Telephone Encounter (Signed)
Ok, thanks.

## 2019-02-16 NOTE — Telephone Encounter (Signed)
Danielle Ayala, PT with [facility] would like verbal orders for HHPT for 1 x wk for 1 wk 2 x wk for 4 wks 1 x wk for 1 wk effective 02/11/2019.  Cb# is [phone].  Please advise.  Thank you.

## 2019-02-17 NOTE — Telephone Encounter (Signed)
Called Monique back no answer LMOM with details approving orders.

## 2019-03-05 ENCOUNTER — Encounter: Payer: Self-pay | Admitting: Orthopaedic Surgery

## 2019-03-05 ENCOUNTER — Ambulatory Visit (INDEPENDENT_AMBULATORY_CARE_PROVIDER_SITE_OTHER): Payer: Medicare Other

## 2019-03-05 ENCOUNTER — Ambulatory Visit (INDEPENDENT_AMBULATORY_CARE_PROVIDER_SITE_OTHER): Payer: Medicare Other | Admitting: Orthopaedic Surgery

## 2019-03-05 ENCOUNTER — Other Ambulatory Visit: Payer: Self-pay

## 2019-03-05 VITALS — Ht 64.0 in | Wt 184.0 lb

## 2019-03-05 DIAGNOSIS — Z96641 Presence of right artificial hip joint: Secondary | ICD-10-CM

## 2019-03-05 NOTE — Progress Notes (Signed)
   Post-Op Visit Note   Patient: Danielle Ayala           Date of Birth: 1941-01-10           MRN: [id number] Visit Date: 03/05/2019 PCP: Jolene Provost, MD   Assessment & Plan:  Chief Complaint:  Chief Complaint  Patient presents with  . Right Hip - Follow-up    Right total hip arthroplasty 01/23/2019   Visit Diagnoses:  1. Status post total replacement of right hip     Plan: Danielle Ayala is 6 weeks status post right total hip for femoral neck fracture.  She is doing really well.  She is ambulating nicely with a walker.  Still doing home exercises and home physical therapy twice a week.  She does have some soreness in her thigh but otherwise no complaints.  Physical exam shows a fully healed surgical scar.  Good range of motion of the hip without pain.  X-rays are unremarkable.  Danielle Ayala is doing very well from my standpoint.  We will fax in a prescription for a lift chair to help with ambulation.  We will see her back in 6 weeks with standing AP pelvis x-ray.  Follow-Up Instructions: Return in about 6 weeks (around 04/16/2019).   Orders:  Orders Placed This Encounter  Procedures  . XR HIP UNILAT W OR W/O PELVIS 2-3 VIEWS RIGHT   No orders of the defined types were placed in this encounter.   Imaging: XR HIP UNILAT W OR W/O PELVIS 2-3 VIEWS RIGHT  Result Date: 03/05/2019 Stable right total hip replacement without complication.   PMFS History: Patient Active Problem List   Diagnosis Date Noted  . Status post right hip replacement 02/03/2019  . Acute kidney injury ([facility]) 02/03/2019  . 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 Neck pain, chronic 02/03/2019  . Constipation 02/03/2019  . Closed fracture of neck of right femur ([facility]) 01/22/2019  . Essential hypertension 01/22/2019  . Normochromic normocytic anemia 01/22/2019  . Closed right hip fracture, initial encounter ([facility]) 01/22/2019   Past Medical History:  Diagnosis Date  . Back pain   . Concussion 1967 & 1985  .  Hypertension   . Neck pain     Family History  Problem Relation Age of Onset  . Cancer Mother     Past Surgical History:  Procedure Laterality Date  . BACK SURGERY    . NEPHRECTOMY Left   . TONSILLECTOMY    . TOTAL HIP ARTHROPLASTY Right 01/23/2019   Procedure: TOTAL HIP ARTHROPLASTY ANTERIOR APPROACH;  Surgeon: Tarry Kos, MD;  Location: [facility] OR;  Service: Orthopedics;  Laterality: Right;  . UMBILICAL HERNIA REPAIR     Social History   Occupational History  . Not on file  Tobacco Use  . Smoking status: Never Smoker  . Smokeless tobacco: Never Used  Substance and Sexual Activity  . Alcohol use: Never  . Drug use: Never  . Sexual activity: Not on file

## 2019-03-09 ENCOUNTER — Other Ambulatory Visit: Payer: Self-pay | Admitting: Internal Medicine

## 2019-03-10 ENCOUNTER — Other Ambulatory Visit: Payer: Self-pay | Admitting: Internal Medicine

## 2019-03-18 ENCOUNTER — Encounter: Payer: Self-pay | Admitting: Orthopaedic Surgery

## 2019-03-18 ENCOUNTER — Ambulatory Visit (INDEPENDENT_AMBULATORY_CARE_PROVIDER_SITE_OTHER): Payer: Medicare Other | Admitting: Orthopaedic Surgery

## 2019-03-18 ENCOUNTER — Ambulatory Visit: Payer: Self-pay

## 2019-03-18 ENCOUNTER — Other Ambulatory Visit: Payer: Self-pay

## 2019-03-18 DIAGNOSIS — M545 Low back pain, unspecified: Secondary | ICD-10-CM

## 2019-03-18 MED ORDER — METHOCARBAMOL 500 MG PO TABS: 500.0000 mg | ORAL_TABLET | Freq: Two times a day (BID) | ORAL | 0 refills | Status: AC | PRN

## 2019-03-18 MED ORDER — METHYLPREDNISOLONE 4 MG PO TBPK: ORAL_TABLET | ORAL | 0 refills | Status: AC

## 2019-03-18 MED ORDER — TRAMADOL HCL 50 MG PO TABS: 50.0000 mg | ORAL_TABLET | Freq: Three times a day (TID) | ORAL | 0 refills | Status: AC | PRN

## 2019-03-18 NOTE — Progress Notes (Signed)
Office Visit Note   Patient: Danielle Ayala           Date of Birth: 11-06-1940           MRN: [id number] Visit Date: 03/18/2019              Requested by: Jolene Provost, MD [address] [place],  [place] [address] PCP: Jolene Provost, MD   Assessment & Plan: Visit Diagnoses:  1. Low back pain, unspecified back pain laterality, unspecified chronicity, unspecified whether sciatica present     Plan: Impression is chronic lower back pain.  We will start the patient on the steroid taper and muscle relaxer.  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  She has not had a recent MRI of her lumbar spine so that may be our next step if her pain does not improve.  She will follow-up with [place] as needed for her back.  She will follow-up with [place] for her regularly scheduled appointment for her right total hip replacement.  Follow-Up Instructions: Return for regularly schedule appt for right hip replacement.   Orders:  Orders Placed This Encounter  Procedures  . XR Lumbar Spine 2-3 Views   Meds ordered this encounter  Medications  . methylPREDNISolone (MEDROL DOSEPAK) 4 MG TBPK tablet    Sig: Take as directed    Dispense:  21 tablet    Refill:  0  . methocarbamol (ROBAXIN) 500 MG tablet    Sig: Take 1 tablet (500 mg total) by mouth 2 (two) times daily as needed.    Dispense:  20 tablet    Refill:  0  . traMADol (ULTRAM) 50 MG tablet    Sig: Take 1 tablet (50 mg total) by mouth 3 (three) times daily as needed.    Dispense:  30 tablet    Refill:  0      Procedures: No procedures performed   Clinical Data: No additional findings.   Subjective: Chief Complaint  Patient presents with  . Lower Back - Pain    HPI patient is a pleasant 79 year old female who comes in today with chronic bilateral lower back pain and muscle spasms.  She has had the symptoms for  years but recently aggravated over the past week or so.  No specific injury.  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  She has recently finished physical therapy.  The pain she has is primarily to both sides of the lower back.  No radiation down the legs.  Her pain and muscle spasms start when she is standing in 1 place for period of time such as when she is preparing a meal or washing dishes.  She has been taking Tylenol without relief of symptoms.  She denies any numbness, tingling or burning.  No previous lumbar spine surgical intervention but she does note a previous ESI several years ago.  Review of Systems as detailed in HPI.  All others reviewed and are negative.   Objective: Vital Signs: There were no vitals taken for this visit.  Physical Exam well-developed well-nourished female no acute distress.  Alert and oriented x3.  Ortho Exam examination of her lumbar spine reveals no spinous tenderness.  She has moderate paraspinous tenderness on the right.  None on the left.  Full and painless range of motion with lumbar  flexion, extension and rotation.  Minimally positive straight leg raise on the left.  None on the right.  No focal weakness.  She is neurovascular intact distally.  Specialty Comments:  No specialty comments available.  Imaging: XR Lumbar Spine 2-3 Views  Result Date: 03/18/2019 Degenerative scoliosis and spondylolisthesis.  No acute abnormalities.    PMFS History: Patient Active Problem List   Diagnosis Date Noted  . Status post right hip replacement 02/03/2019  . Acute kidney injury ([place]) 02/03/2019  . 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 Neck pain, chronic 02/03/2019  . Constipation 02/03/2019  . Closed fracture of neck of right femur ([place]) 01/22/2019  . Essential hypertension 01/22/2019  . Normochromic normocytic anemia 01/22/2019  . Closed right hip fracture, initial encounter ([place]) 01/22/2019   Past Medical  History:  Diagnosis Date  . Back pain   . [place]  . Hypertension   . Neck pain     Family History  Problem Relation Age of Onset  . Cancer Mother     Past Surgical History:  Procedure Laterality Date  . BACK SURGERY    . NEPHRECTOMY Left   . TONSILLECTOMY    . TOTAL HIP ARTHROPLASTY Right 01/23/2019   Procedure: TOTAL HIP ARTHROPLASTY ANTERIOR APPROACH;  Surgeon: Leandrew Koyanagi, MD;  Location: [facility];  Service: Orthopedics;  Laterality: Right;  . UMBILICAL HERNIA REPAIR     Social History   Occupational History  . Not on file  Tobacco Use  . Smoking status: Never Smoker  . Smokeless tobacco: Never Used  Substance and Sexual Activity  . Alcohol use: Never  . Drug use: Never  . Sexual activity: Not on file

## 2019-04-16 ENCOUNTER — Ambulatory Visit: Payer: Medicare Other | Admitting: Orthopaedic Surgery

## 2020-08-24 IMAGING — RF DG C-ARM 1-60 MIN
1 series · 14 of 14 positions shown · non-contrast
Comparison: Right hip radiographs-01/22/2019

CLINICAL DATA: Right femoral neck fracture.

EXAM:
DG C-ARM 1-60 MIN; OPERATIVE RIGHT HIP WITH PELVIS
FLUOROSCOPY TIME:  35 seconds

[Series 1: run · 14 of 14 slices shown]
[im 1/14]
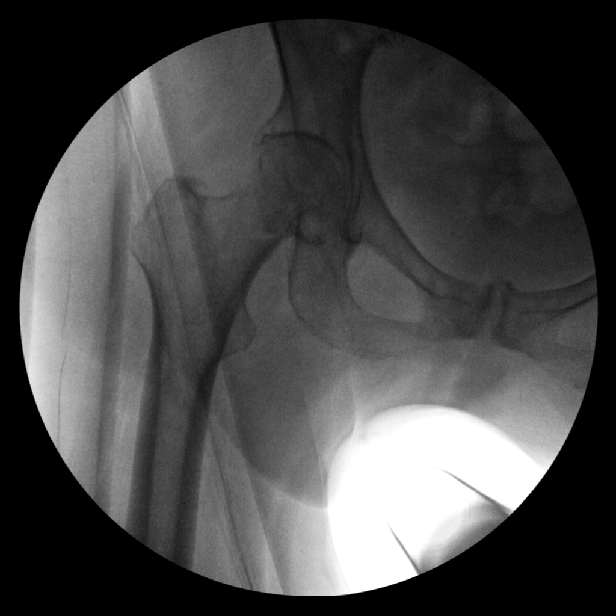
[im 2/14]
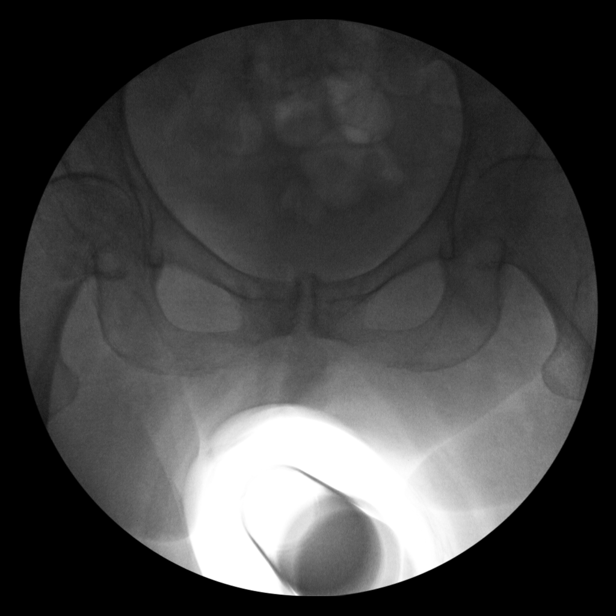
[im 3/14]
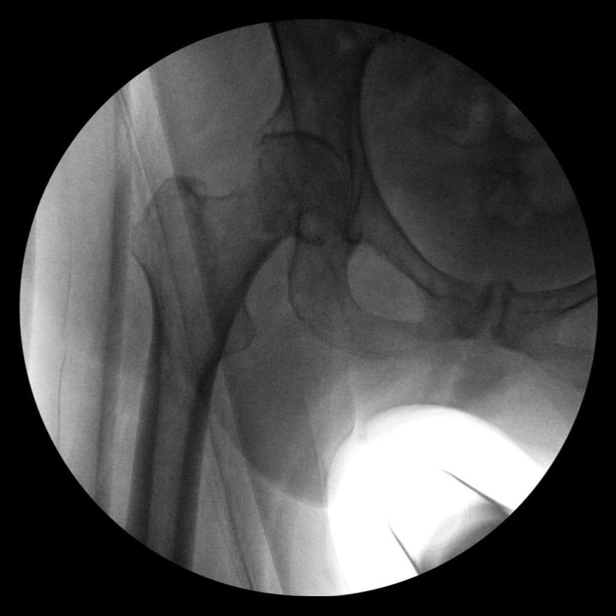
[im 4/14]
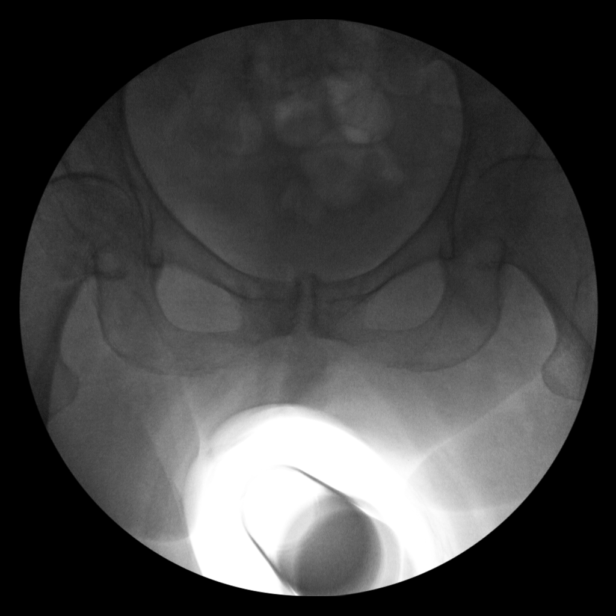
[im 5/14]
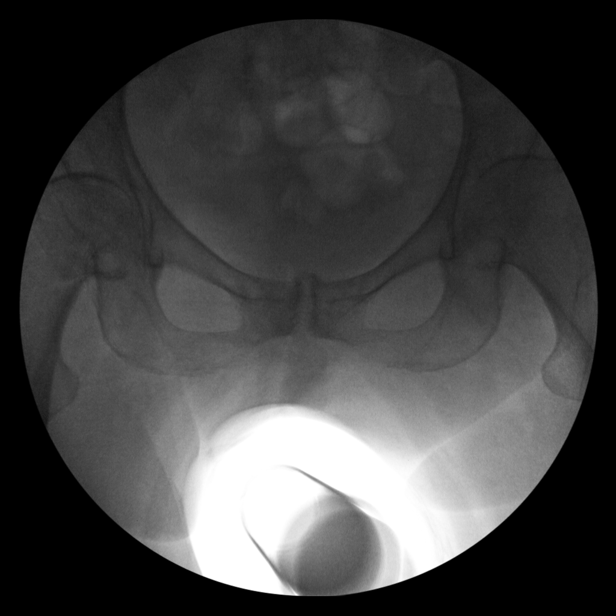
[im 6/14]
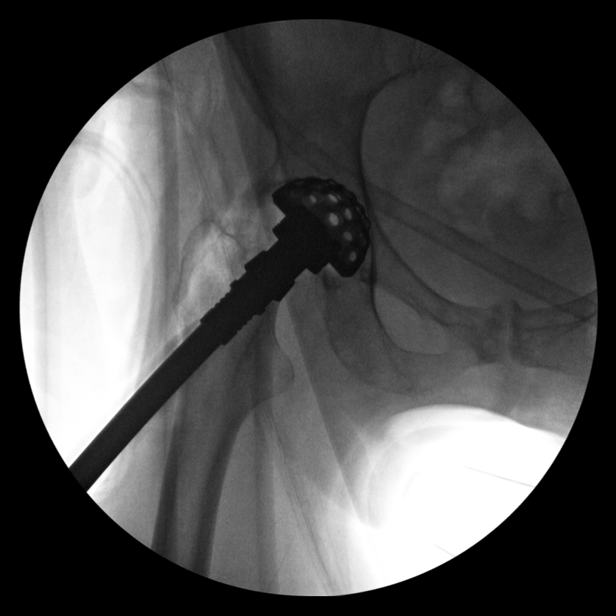
[im 7/14]
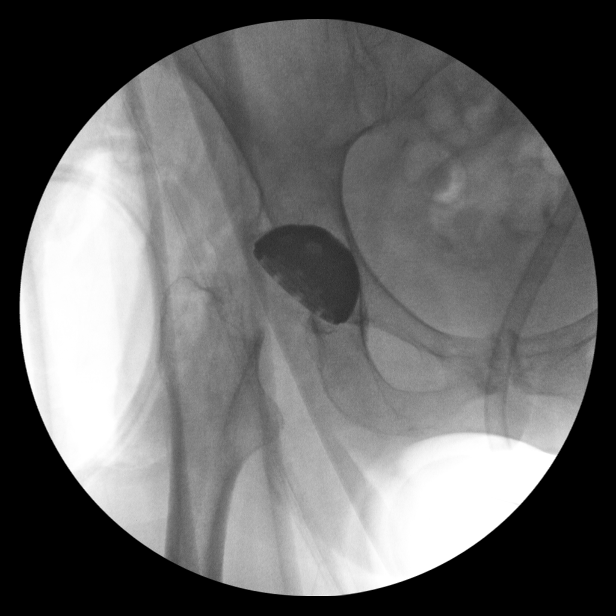
[im 8/14]
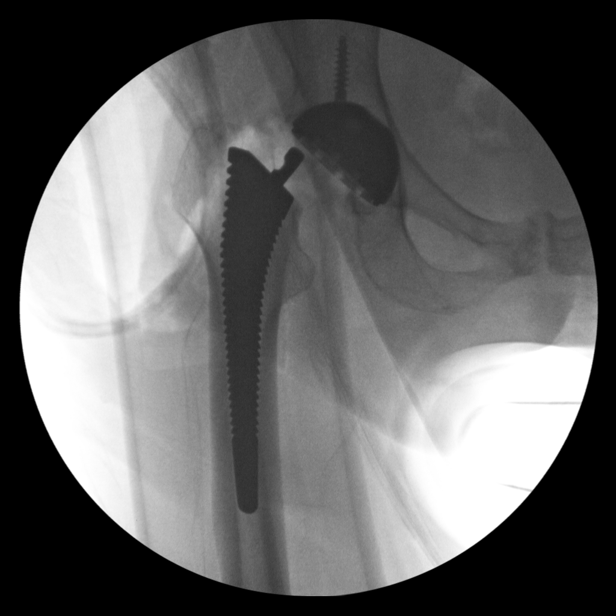
[im 9/14]
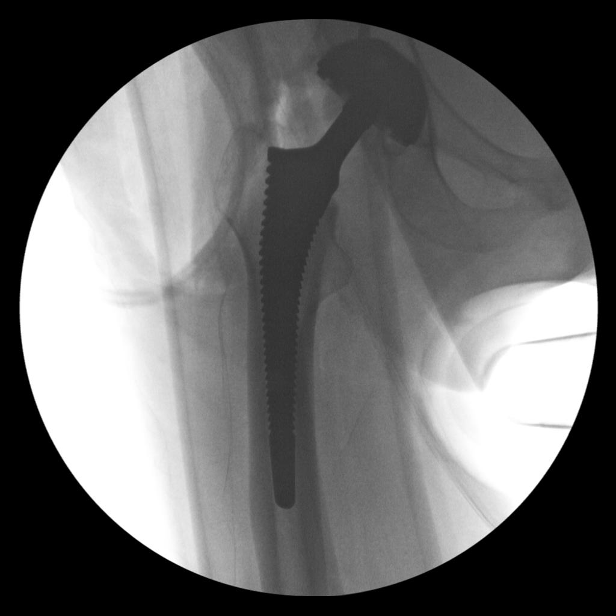
[im 10/14]
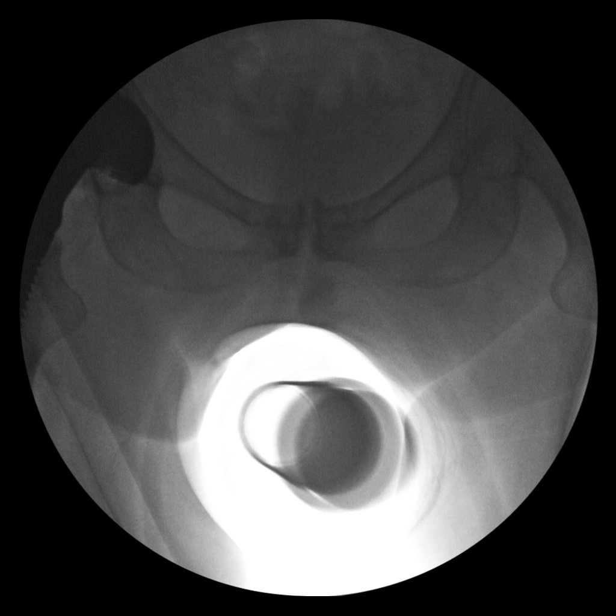
[im 11/14]
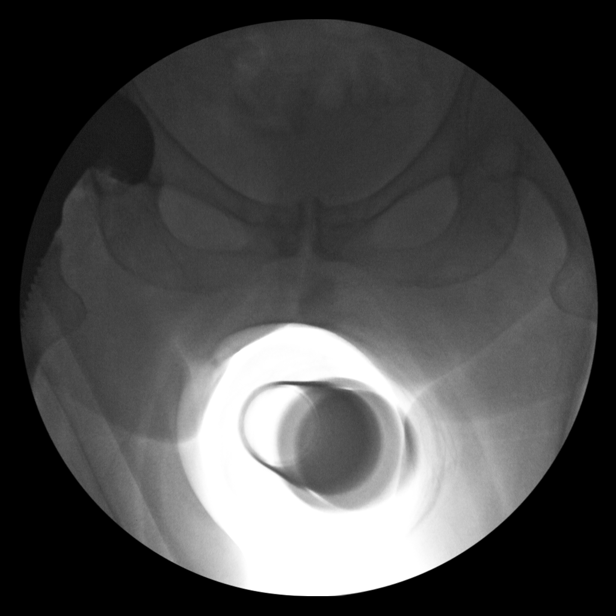
[im 12/14]
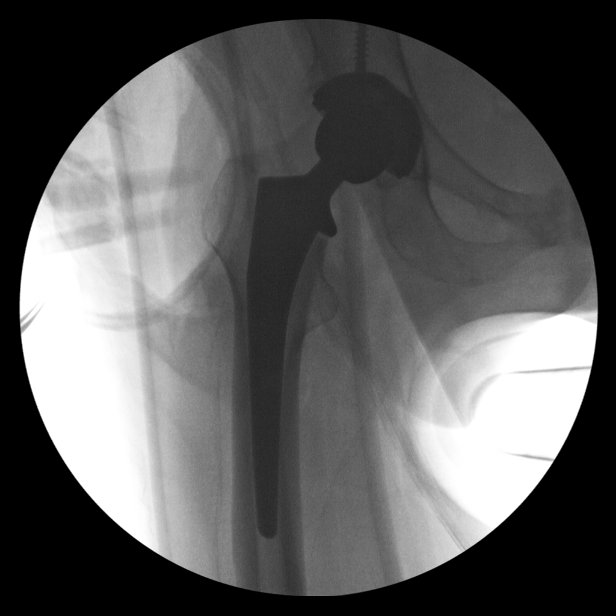
[im 13/14]
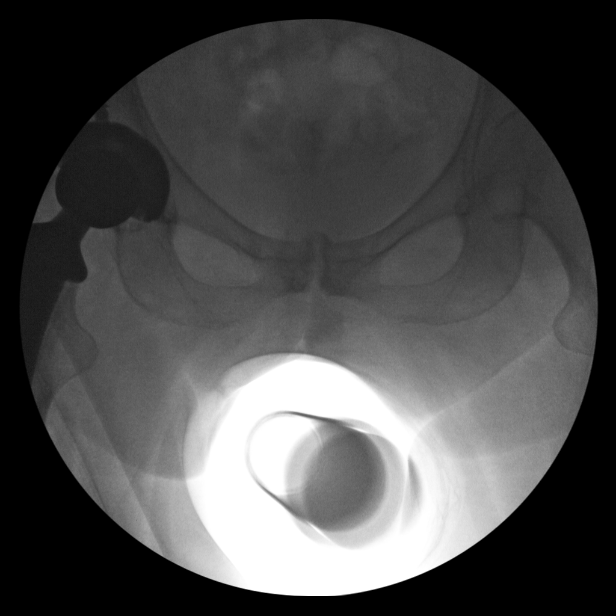
[im 14/14]
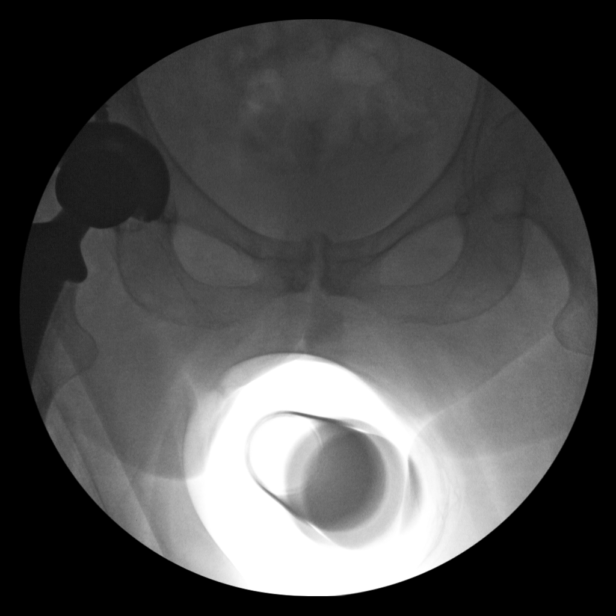

[14 of 14 positions shown; findings below may reference images not displayed]

FINDINGS: 14 spot intraoperative fluoroscopic images of the right hip and
lower pelvis are provided for review

Provided images demonstrate the sequela of right total hip
replacement. Alignment appears anatomic given AP projection. There
is a minimal amount of expected subcutaneous emphysema about the
operative site. No radiopaque foreign body.

Limited visualization of the lower pelvis demonstrates mild
degenerative change of the pubic symphysis.
IMPRESSION: Post right total hip replacement without evidence of complication.
